# Patient Record
Sex: Female | Born: 1970 | Race: Black or African American | Hispanic: No | Marital: Married | Smoking: Current some day smoker
Health system: Southern US, Community
[De-identification: ages and names within clinical notes are randomized; demographics above are authoritative.]

## PROBLEM LIST (undated history)

## (undated) DIAGNOSIS — E7800 Pure hypercholesterolemia, unspecified: Secondary | ICD-10-CM

## (undated) DIAGNOSIS — I10 Essential (primary) hypertension: Secondary | ICD-10-CM

## (undated) DIAGNOSIS — M5136 Other intervertebral disc degeneration, lumbar region: Secondary | ICD-10-CM

## (undated) DIAGNOSIS — M51369 Other intervertebral disc degeneration, lumbar region without mention of lumbar back pain or lower extremity pain: Secondary | ICD-10-CM

## (undated) DIAGNOSIS — K219 Gastro-esophageal reflux disease without esophagitis: Secondary | ICD-10-CM

## (undated) HISTORY — PX: ABDOMINAL HYSTERECTOMY: SHX81

---

## 2001-07-31 ENCOUNTER — Other Ambulatory Visit: Admission: RE | Admit: 2001-07-31 | Discharge: 2001-07-31 | Payer: Self-pay | Admitting: Obstetrics and Gynecology

## 2003-09-17 ENCOUNTER — Ambulatory Visit (HOSPITAL_COMMUNITY): Admission: RE | Admit: 2003-09-17 | Discharge: 2003-09-17 | Payer: Self-pay | Admitting: Orthopedic Surgery

## 2003-10-21 ENCOUNTER — Other Ambulatory Visit: Admission: RE | Admit: 2003-10-21 | Discharge: 2003-10-21 | Payer: Self-pay | Admitting: Obstetrics and Gynecology

## 2004-10-12 ENCOUNTER — Observation Stay (HOSPITAL_COMMUNITY): Admission: RE | Admit: 2004-10-12 | Discharge: 2004-10-13 | Payer: Self-pay | Admitting: Obstetrics and Gynecology

## 2004-10-12 ENCOUNTER — Encounter (INDEPENDENT_AMBULATORY_CARE_PROVIDER_SITE_OTHER): Payer: Self-pay | Admitting: *Deleted

## 2007-05-11 ENCOUNTER — Emergency Department (HOSPITAL_COMMUNITY): Admission: EM | Admit: 2007-05-11 | Discharge: 2007-05-11 | Payer: Self-pay | Admitting: Emergency Medicine

## 2010-10-14 NOTE — Op Note (Signed)
NAME:  Morgan, Tran              ACCOUNT NO.:  [id number]   MEDICAL RECORD NO.:  [id number]          PATIENT TYPE:  OBV   LOCATION:  9399                          FACILITY:  [facility]   PHYSICIAN:  Janine Limbo, M.D.DATE OF BIRTH:  04-Apr-1971   DATE OF PROCEDURE:  10/12/2004  DATE OF DISCHARGE:                                 OPERATIVE REPORT   PREOPERATIVE DIAGNOSES:  1. Fibroid uterus.  2. Dysmenorrhea.  3. 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     POSTOPERATIVE DIAGNOSES:  1. Fibroid uterus.  2. Dysmenorrhea.  3. 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     PROCEDURE:  Vaginal hysterectomy.   SURGEON:  Janine Limbo, M.D.   FIRST ASSISTANT:  Elmira J. Lowell Guitar, P.A.-C.   ANESTHETIC:  General.   DISPOSITION:  Morgan Tran is a 40 year old female, para 2-0-0-2, who presents  with the above-mentioned diagnosis.  Nonsteroidal antiinflammatory agents  have not relieved her discomfort.  Hormonal therapy has not been her best  option.  The patient understands the indications for her surgical procedure,  and she accepts the risk of, but not limited to, anesthetic complications,  bleeding, infections, and possible damage to the surrounding organs.   FINDINGS:  The patient was noted to have a 10-12 size fibroid uterus (187  g).  Her fallopian tubes and ovaries appeared normal.   PROCEDURE:  The patient was taken to the operating room, where a general  anesthetic was given.  The patient's abdomen, perineum, and vagina were  prepped with multiple layers of Betadine.  A Foley catheter was placed in  the bladder.  The patient was sterilely draped.  Examination under  anesthesia was performed.  The cervix was injected with a diluted solution  of Pitressin and saline.  A circumferential incision was made around the  cervix, and the vaginal mucosa was advanced both anteriorly and posteriorly.  The posterior cul-de-sac was entered.  The  anterior cul-de-sac was entered.  Alternating from right to left, the uterosacral ligaments, paracervical  tissues, parametrial tissues, and uterine arteries were clamped, cut,  sutured, and tied securely.  The uterus was inverted through the posterior  colpotomy.  The upper pedicles were secured.  The uterus was transected from  the operative field.  The upper pedicles were then suture ligated and then  free tied.  There was a small amount of bleeding from the vaginal cuff, so  the vaginal cuff, so the vaginal cuff was sutured using a running-locking  suture.  There was a small amount of bleeding to the pelvic sidewall, and  figure-of-eight sutures were used for hemostasis.  Care was taken not to  damage any of the vital underlying structures.  Hemostasis was noted to be  adequate.  The sutures attached to the uterosacral ligaments were brought  out through the vaginal angles and tied securely.  A McCall culdoplasty  suture was placed in the posterior cul-de-sac incorporating the uterosacral  ligaments bilaterally and the posterior peritoneum.  A final check was made  for hemostasis, and again hemostasis was confirmed.  The vaginal cuff was  then closed using figure-of-eight sutures, including the anterior vaginal  mucosa, the anterior peritoneum, the posterior peritoneum, and the posterior  vaginal mucosa.  The McCall culdoplasty suture was tied securely, and the  apex of the vagina was noted to elevate into the midpelvis.  Sponge, needle,  and instrument counts were correct on two occasions.  The estimated blood  loss for the procedure was 200 cc.  The patient received 1,100 cc of fluid  through her IV line.  The estimated urine output was 350 cc.  The patient  was noted to drain clear yellow urine at the end of her procedure.  The  patient tolerated her procedure well.  She was awakened from her anesthetic  without difficulty.  She was taken to the recovery room in stable condition.  0  Vicryl was the suture material used throughout the procedure.      AVS/MEDQ  D:  10/12/2004  T:  10/12/2004  Job:  [id number]

## 2010-10-14 NOTE — H&P (Signed)
NAME:  Morgan Tran, Morgan Tran              ACCOUNT NO.:  [id number]   MEDICAL RECORD NO.:  [id number]          PATIENT TYPE:  AMB   LOCATION:  SDC                           [facility]  [facility]   PHYSICIAN:  Janine Limbo, M.D.DATE OF BIRTH:  04-04-1971   DATE OF ADMISSION:  10/12/2004  DATE OF DISCHARGE:                                HISTORY & PHYSICAL   HISTORY OF PRESENT ILLNESS:  Morgan Tran is a 40 year old female, para 2-0-0-  2, who presents for a vaginal hysterectomy.  The patient has been followed  at the [facility] for [facility].  The patient has a known history of fibroids and  dysmenorrhea.  Her discomfort has not been relieved by pain medication and  nonsteroidal antiinflammatory agents.  In addition, the patient had a Pap  smear that showed a low grade squamous intraepithelial lesion.  Colposcopically directed biopsies were performed, in April 2006, and that  showed a high grade squamous intraepithelial lesion.  The endocervical  curettage was benign.  The patient has had one cesarean delivery in the  past.  The patient denies a past history sexually transmitted infections.   OBSTETRICAL HISTORY:  The patient has had one term vaginal delivery and one  term cesarean delivery.   DRUG ALLERGIES:  The patient says that she is allergic to PENICILLIN and  this causes hives.   PAST MEDICAL HISTORY:  1. The patient has a history of hypertension and she currently takes      metoprolol and Triam.  2. She had a hernia repair at age 40.  3. She has migraine headaches.     SOCIAL HISTORY:  The patient denies cigarette use, alcohol use, and  recreational drug use.   REVIEW OF SYSTEMS:  The patient complains of dyspareunia as well as the  conditions mentioned above.   FAMILY HISTORY:  Noncontributory.   PHYSICAL EXAMINATION:  VITAL SIGNS:  Weight is 177 pounds.  HEENT:  Within normal limits.  CHEST:  Clear.  HEART:   Regular rate and rhythm.  BREASTS:  Without masses.  ABDOMEN:  Nontender.  EXTREMITIES:  Within normal limits.  NEUROLOGIC:  Grossly normal.  PELVIC:  External genitalia is normal.  The vagina is normal except for a  small cystocele.  The cervix is nontender and no lesions are present.  The  uterus is eight weeks size and irregular.  Adnexa:  No masses.  Rectovaginal  exam confirms.   LABORATORY VALUES:  The patient's endometrial biopsy was benign.  Her  ultrasound showed a 10.2 x 6.8-cm uterus with multiple fibroids.  The  largest fibroid measured 2.4 x 1.8-cm.  The endometrial thickness was 0.6-  cm.  The ovaries appeared normal on ultrasound.   ASSESSMENT:  1. Fibroid uterus.  2. Dysmenorrhea.  3. High grade squamous intraepithelial lesion.     PLAN:  The patient will undergo a vaginal hysterectomy.  She understands the  indications for her procedure, and she accepts the risk of, but no limited  to, anesthetic complications, bleeding, infections, and possible damage to  the surrounding  organs.      AVS/MEDQ  D:  10/11/2004  T:  10/11/2004  Job:  [id number]

## 2010-10-14 NOTE — Discharge Summary (Signed)
NAME:  Morgan Tran, Morgan Tran              ACCOUNT NO.:  [id number]   MEDICAL RECORD NO.:  [id number]          PATIENT TYPE:  OBV   LOCATION:  9316                          [facility]   PHYSICIAN:  Janine Limbo, M.D.DATE OF BIRTH:  [birth year]   DATE OF ADMISSION:  10/12/2004  DATE OF DISCHARGE:  10/13/2004                                 DISCHARGE SUMMARY   DISCHARGE DIAGNOSES:  1.  Fibroid uterus.  2.  Dysmenorrhea.  3.  Cervical intraepithelial neoplasia grade 3.   OPERATION:  On the date of admission the patient underwent a total vaginal  hysterectomy and tolerated the procedure well. The patient was found to have  a fibroid uterus which weighed 187 g with normal-appearing tubes and ovaries  bilaterally.   HISTORY OF PRESENT ILLNESS:  Ms. Morgan Tran is a 40 year old female para 2-0-0-2  who presents for vaginal hysterectomy because of dysmenorrhea and a high-  grade squamous intraepithelial lesion. Please see the patient's dictated  History and Physical Examination for details.   PREOPERATIVE PHYSICAL EXAMINATION:  VITAL SIGNS:  Weight is 177 pounds.  GENERAL:  Within normal limits.  PELVIC:  External genitalia is normal, vagina is normal except for a small  cystocele. Cervix is nontender and no lesions are present. Uterus is 8-week  size and irregular. Adnexa without masses. Rectovaginal exam confirms.   HOSPITAL COURSE:  On the date of admission the patient underwent  aforementioned procedure, tolerating it well. Postoperative course was  unremarkable with the patient having a postoperative hemoglobin of 13.3  (preoperative hemoglobin 14.9). By postoperative day #1 the patient had  resumed bowel and bladder function and was therefore deemed ready for  discharge home.   DISCHARGE MEDICATIONS:  1.  Phenergan 25 mg one tablet q.6h. as needed for nausea.  2.  Ibuprofen 600 mg one tablet with food q.6h. for 3 days, then as needed      for pain.  3.  Vicodin one to two tablets  q.4-6h. as needed for pain.  4.  Colace 100 mg one tablet twice daily until bowel movements are regular.  5.  Metoprolol 50 mg one tablet twice daily.  6.  Triamterine/hydrochlorothiazide 37.5/25 one tablet daily.   FOLLOW-UP:  The patient is scheduled for a 6 weeks postoperative visit with  Dr. Stefano Gaul on November 23, 2004 at 11 a.m.   DISCHARGE INSTRUCTIONS:  The patient was given a copy of [facility] postoperative instruction sheet. She is further advised to avoid  driving for 2 weeks, heavy lifting for 4 weeks, and intercourse for 6 weeks.  The patient's diet was without restriction.   FINAL PATHOLOGY:  Uterus and cervix:  Cervix with high-grade squamous  intraepithelial lesion, CIN-3. Endometrium:  Secretory endometrium, no  hyperplasia or malignancy identified. Myometrium:  Multiple leiomyomas.       EJP/MEDQ  D:  10/27/2004  T:  10/27/2004  Job:  [id number]

## 2010-10-19 ENCOUNTER — Emergency Department (HOSPITAL_COMMUNITY)
Admission: EM | Admit: 2010-10-19 | Discharge: 2010-10-19 | Disposition: A | Discharge status: Home / Self Care | Payer: PRIVATE HEALTH INSURANCE | Attending: Emergency Medicine | Admitting: Emergency Medicine

## 2010-10-19 ENCOUNTER — Emergency Department (HOSPITAL_COMMUNITY): Payer: PRIVATE HEALTH INSURANCE

## 2010-10-19 DIAGNOSIS — G43909 Migraine, unspecified, not intractable, without status migrainosus: Secondary | ICD-10-CM | POA: Insufficient documentation

## 2010-10-19 DIAGNOSIS — X58XXXA Exposure to other specified factors, initial encounter: Secondary | ICD-10-CM | POA: Insufficient documentation

## 2010-10-19 DIAGNOSIS — I10 Essential (primary) hypertension: Secondary | ICD-10-CM | POA: Insufficient documentation

## 2010-10-19 DIAGNOSIS — IMO0002 Reserved for concepts with insufficient information to code with codable children: Secondary | ICD-10-CM | POA: Insufficient documentation

## 2012-08-14 DIAGNOSIS — E8940 Asymptomatic postprocedural ovarian failure: Secondary | ICD-10-CM | POA: Insufficient documentation

## 2012-08-14 DIAGNOSIS — I10 Essential (primary) hypertension: Secondary | ICD-10-CM | POA: Insufficient documentation

## 2012-08-16 DIAGNOSIS — E785 Hyperlipidemia, unspecified: Secondary | ICD-10-CM | POA: Insufficient documentation

## 2013-04-17 ENCOUNTER — Emergency Department (HOSPITAL_COMMUNITY)
Admission: EM | Admit: 2013-04-17 | Discharge: 2013-04-17 | Disposition: A | Discharge status: Home / Self Care | Payer: Managed Care, Other (non HMO) | Attending: Emergency Medicine | Admitting: Emergency Medicine

## 2013-04-17 ENCOUNTER — Encounter (HOSPITAL_COMMUNITY): Payer: Self-pay | Admitting: Emergency Medicine

## 2013-04-17 DIAGNOSIS — I10 Essential (primary) hypertension: Secondary | ICD-10-CM | POA: Insufficient documentation

## 2013-04-17 DIAGNOSIS — Y990 Civilian activity done for income or pay: Secondary | ICD-10-CM | POA: Insufficient documentation

## 2013-04-17 DIAGNOSIS — Z880 Allergy status to penicillin: Secondary | ICD-10-CM | POA: Insufficient documentation

## 2013-04-17 DIAGNOSIS — Y9389 Activity, other specified: Secondary | ICD-10-CM | POA: Insufficient documentation

## 2013-04-17 DIAGNOSIS — IMO0002 Reserved for concepts with insufficient information to code with codable children: Secondary | ICD-10-CM | POA: Insufficient documentation

## 2013-04-17 DIAGNOSIS — Y9289 Other specified places as the place of occurrence of the external cause: Secondary | ICD-10-CM | POA: Insufficient documentation

## 2013-04-17 DIAGNOSIS — S3992XA Unspecified injury of lower back, initial encounter: Secondary | ICD-10-CM

## 2013-04-17 DIAGNOSIS — E7800 Pure hypercholesterolemia, unspecified: Secondary | ICD-10-CM | POA: Insufficient documentation

## 2013-04-17 DIAGNOSIS — X500XXA Overexertion from strenuous movement or load, initial encounter: Secondary | ICD-10-CM | POA: Insufficient documentation

## 2013-04-17 DIAGNOSIS — M5380 Other specified dorsopathies, site unspecified: Secondary | ICD-10-CM | POA: Insufficient documentation

## 2013-04-17 HISTORY — DX: Essential (primary) hypertension: I10

## 2013-04-17 HISTORY — DX: Pure hypercholesterolemia, unspecified: E78.00

## 2013-04-17 MED ORDER — CYCLOBENZAPRINE HCL 10 MG PO TABS
10.0000 mg | ORAL_TABLET | Freq: Once | ORAL | Status: AC
  Administered 2013-04-17: 10 mg via ORAL
  Filled 2013-04-17: qty 1

## 2013-04-17 MED ORDER — OXYCODONE-ACETAMINOPHEN 5-325 MG PO TABS
1.0000 | ORAL_TABLET | Freq: Once | ORAL | Status: AC
  Administered 2013-04-17: 1 via ORAL
  Filled 2013-04-17: qty 1

## 2013-04-17 MED ORDER — KETOROLAC TROMETHAMINE 60 MG/2ML IM SOLN
60.0000 mg | Freq: Once | INTRAMUSCULAR | Status: AC
  Administered 2013-04-17: 60 mg via INTRAMUSCULAR
  Filled 2013-04-17: qty 2

## 2013-04-17 MED ORDER — PREDNISONE (PAK) 10 MG PO TABS: ORAL_TABLET | Freq: Every day | ORAL | Status: DC

## 2013-04-17 MED ORDER — OXYCODONE-ACETAMINOPHEN 5-325 MG PO TABS: 1.0000 | ORAL_TABLET | ORAL | Status: DC | PRN

## 2013-04-17 MED ORDER — DIAZEPAM 5 MG PO TABS: 5.0000 mg | ORAL_TABLET | Freq: Two times a day (BID) | ORAL | Status: DC

## 2013-04-17 NOTE — ED Provider Notes (Signed)
CSN: [id number]     Arrival date & time 04/17/13  4098 History   First MD Initiated Contact with Patient 04/17/13 [phone]     Chief Complaint  Patient presents with  . Back Pain   (Consider location/radiation/quality/duration/timing/severity/associated sxs/prior Treatment) Patient is a 42 y.o. female presenting with back pain. The history is provided by the patient.  Back Pain Location:  Lumbar spine Quality:  Shooting Radiates to:  R posterior upper leg Pain severity:  Severe Worse during: worse withmovement. Onset quality:  Sudden Duration:  1 day Timing:  Constant Progression:  Worsening Chronicity:  New Relieved by:  Nothing Worsened by:  Movement, standing, twisting, ambulation and bending Ineffective treatments:  Muscle relaxants, cold packs and heating pad Associated symptoms: leg pain   Associated symptoms: no abdominal pain, no bladder incontinence, no bowel incontinence, no dysuria, no headaches, no pelvic pain and no weakness    Morgan Tran is a 42 y.o. female who presents to the ED with low back pain. She states she was in the bathroom at work and leaned over to get the soap and felt a sharp pain in the right side of her lower back. She took muscle relaxants x 2 last night and applied heat and then ice to the area. She was able to sleep last night but this morning when she started to get out of bed there was so much pain in her right side and right leg she had difficulty bearing weight on the right leg. She sat on the side of the bed for a while and then was able to get up. History of similar episode about 4 years ago and admitted to [facility] for a week. Had CT that showed bulging disc in lower lumbar area.  The flexeril she took last night was left over from that episode.   Past Medical History  Diagnosis Date  . Hypertension   . Hypercholesterolemia    Past Surgical History  Procedure Laterality Date  . Cesarean section    . Abdominal hysterectomy     No  family history on file. History  Substance Use Topics  . Smoking status: Never Smoker   . Smokeless tobacco: Not on file  . Alcohol Use: No   OB History   Grav Para Term Preterm Abortions TAB SAB Ect Mult Living                 Review of Systems  Constitutional: Negative for activity change.  HENT: Negative.   Eyes: Negative for visual disturbance.  Gastrointestinal: Negative for nausea, vomiting, abdominal pain and bowel incontinence.  Genitourinary: Negative for bladder incontinence, dysuria and pelvic pain.  Musculoskeletal: Positive for back pain.  Skin: Negative for rash.  Allergic/Immunologic: Negative for immunocompromised state.  Neurological: Negative for syncope, weakness, light-headedness and headaches.  Psychiatric/Behavioral: The patient is not nervous/anxious.     Allergies  Penicillins  Home Medications  No current outpatient prescriptions on file. BP 144/91  Pulse 84  Temp(Src) 98.3 F (36.8 C) (Oral)  Resp 18  Ht 5\' 6"  (1.676 m)  Wt 202 lb (91.627 kg)  BMI 32.62 kg/m2  SpO2 100% Physical Exam  Nursing note and vitals reviewed. Constitutional: She is oriented to person, place, and time. She appears well-developed and well-nourished. No distress.  Appears to be in extreme pain  HENT:  Head: Normocephalic and atraumatic.  Eyes: Conjunctivae and EOM are normal.  Neck: Normal range of motion. Neck supple.  Cardiovascular: Normal rate, regular rhythm and normal  heart sounds.   Pulmonary/Chest: Effort normal and breath sounds normal.  Abdominal: Soft. Bowel sounds are normal. There is no tenderness.  Musculoskeletal:       Lumbar back: She exhibits decreased range of motion, tenderness, pain and spasm. She exhibits normal pulse.       Back:  Pedal pulses equal bilateral, adequate circulation, good touch sensation. Difficulty with straight leg raises on the right.   Neurological: She is alert and oriented to person, place, and time. She has normal  strength and normal reflexes. No cranial nerve deficit or sensory deficit. Gait (due to pain, no foot drag) abnormal.  Skin: Skin is warm and dry.  Psychiatric: She has a normal mood and affect. Her behavior is normal.    ED Course  Procedures   EKG Interpretation   None       MDM  42 y.o. female with severe pain right lower back with radiation to the right leg. No loss of control of bladder or bowels. She has had some improvement after Flexeril 10 mg, Toradol 60 mg. IM and Percocet 5 mg. I have discussed in detail with the patient need for follow up and she voices understanding. She is stable for discharge without any immediate complications. Will start muscle relaxant, prednisone and pain management. She will return for any problems.    Medication List         diazepam 5 MG tablet  Commonly known as:  VALIUM  Take 1 tablet (5 mg total) by mouth 2 (two) times daily.     oxyCODONE-acetaminophen 5-325 MG per tablet  Commonly known as:  ROXICET  Take 1 tablet by mouth every 4 (four) hours as needed for severe pain.     predniSONE 10 MG tablet  Commonly known as:  STERAPRED UNI-PAK  Take by mouth daily. Take 6 tablets PO today then 5, 4, 3, 2, [address] [place], [place] 04/17/13 [phone]

## 2013-04-17 NOTE — ED Notes (Signed)
Pt reports yesterday had just used the bathroom and leaned over to get the soap to wash her hands.   Reports started having pain in lower back.  Pt says pain has gotten worse and radiates down r leg.

## 2013-04-17 NOTE — ED Notes (Signed)
Pt reports low back pain since yesterday when she was bending over. Pain has gotten worse since yesterday and pain now radiates down  right leg.

## 2013-04-18 NOTE — ED Provider Notes (Signed)
  Medical screening examination/treatment/procedure(s) were performed by non-physician practitioner and as supervising physician I was immediately available for consultation/collaboration.  EKG Interpretation   None          Gerhard Munch, MD 04/18/13 [phone]

## 2017-03-07 DIAGNOSIS — M7071 Other bursitis of hip, right hip: Secondary | ICD-10-CM | POA: Diagnosis not present

## 2017-03-07 DIAGNOSIS — M25551 Pain in right hip: Secondary | ICD-10-CM | POA: Diagnosis not present

## 2017-08-27 DIAGNOSIS — I10 Essential (primary) hypertension: Secondary | ICD-10-CM | POA: Diagnosis not present

## 2017-08-27 DIAGNOSIS — Z79899 Other long term (current) drug therapy: Secondary | ICD-10-CM | POA: Diagnosis not present

## 2017-08-27 DIAGNOSIS — M545 Low back pain: Secondary | ICD-10-CM | POA: Diagnosis not present

## 2017-09-10 DIAGNOSIS — I10 Essential (primary) hypertension: Secondary | ICD-10-CM | POA: Diagnosis not present

## 2017-09-10 DIAGNOSIS — M545 Low back pain: Secondary | ICD-10-CM | POA: Diagnosis not present

## 2017-11-16 DIAGNOSIS — H04559 Acquired stenosis of unspecified nasolacrimal duct: Secondary | ICD-10-CM | POA: Diagnosis not present

## 2017-11-24 DIAGNOSIS — H109 Unspecified conjunctivitis: Secondary | ICD-10-CM | POA: Diagnosis not present

## 2018-01-10 DIAGNOSIS — H10021 Other mucopurulent conjunctivitis, right eye: Secondary | ICD-10-CM | POA: Diagnosis not present

## 2018-01-10 DIAGNOSIS — H04309 Unspecified dacryocystitis of unspecified lacrimal passage: Secondary | ICD-10-CM | POA: Diagnosis not present

## 2018-04-22 ENCOUNTER — Other Ambulatory Visit: Payer: Self-pay | Admitting: Family Medicine

## 2018-04-22 DIAGNOSIS — Z1231 Encounter for screening mammogram for malignant neoplasm of breast: Secondary | ICD-10-CM

## 2018-05-02 ENCOUNTER — Encounter (INDEPENDENT_AMBULATORY_CARE_PROVIDER_SITE_OTHER): Payer: Self-pay

## 2018-05-02 ENCOUNTER — Ambulatory Visit
Admission: RE | Admit: 2018-05-02 | Discharge: 2018-05-02 | Disposition: A | Discharge status: Home / Self Care | Payer: 59 | Source: Ambulatory Visit | Attending: Family Medicine | Admitting: Family Medicine

## 2018-05-02 DIAGNOSIS — Z1231 Encounter for screening mammogram for malignant neoplasm of breast: Secondary | ICD-10-CM

## 2018-09-27 ENCOUNTER — Other Ambulatory Visit: Payer: Self-pay | Admitting: Internal Medicine

## 2018-09-27 ENCOUNTER — Encounter: Payer: Self-pay | Admitting: Internal Medicine

## 2018-10-02 ENCOUNTER — Ambulatory Visit: Payer: 59 | Admitting: Internal Medicine

## 2018-11-01 ENCOUNTER — Ambulatory Visit: Payer: 59 | Admitting: Internal Medicine

## 2018-12-11 ENCOUNTER — Ambulatory Visit (INDEPENDENT_AMBULATORY_CARE_PROVIDER_SITE_OTHER): Payer: Commercial Managed Care - PPO | Admitting: Internal Medicine

## 2018-12-11 ENCOUNTER — Other Ambulatory Visit: Payer: Self-pay

## 2018-12-11 ENCOUNTER — Encounter: Payer: Self-pay | Admitting: Internal Medicine

## 2018-12-11 VITALS — BP 132/74 | HR 90 | Ht 66.0 in | Wt 218.0 lb

## 2018-12-11 DIAGNOSIS — I10 Essential (primary) hypertension: Secondary | ICD-10-CM | POA: Diagnosis not present

## 2018-12-11 DIAGNOSIS — M5136 Other intervertebral disc degeneration, lumbar region: Secondary | ICD-10-CM | POA: Diagnosis not present

## 2018-12-11 DIAGNOSIS — Z6835 Body mass index (BMI) 35.0-35.9, adult: Secondary | ICD-10-CM

## 2018-12-11 DIAGNOSIS — F17200 Nicotine dependence, unspecified, uncomplicated: Secondary | ICD-10-CM

## 2018-12-11 DIAGNOSIS — K219 Gastro-esophageal reflux disease without esophagitis: Secondary | ICD-10-CM

## 2018-12-11 DIAGNOSIS — M51369 Other intervertebral disc degeneration, lumbar region without mention of lumbar back pain or lower extremity pain: Secondary | ICD-10-CM

## 2018-12-11 NOTE — Progress Notes (Signed)
Date:  12/11/2018   Name:  Morgan Tran   DOB:  [birth year]   MRN:  [id number]   Chief Complaint: Establish Care (Moved to [facility] for 6 years and moved to [place] in May of last year. ), Generalized Body Aches, Gastroesophageal Reflux (Had lump in throat- now gone. But does have acid reflux. ), and Back Pain (Bulging discs in back. Back pain on and off. Back goes out once yearly.  )  Gastroesophageal Reflux She complains of heartburn. She reports no abdominal pain, no chest pain, no coughing, no sore throat or no wheezing. This is a recurrent problem. The problem occurs occasionally. The problem has been resolved. The symptoms are aggravated by certain foods (tomato or ketchup). Pertinent negatives include no fatigue. She has tried an antacid for the symptoms. The treatment provided significant relief.  Back Pain This is a chronic problem. The problem occurs intermittently. The pain is present in the lumbar spine. Pertinent negatives include no abdominal pain, chest pain, dysuria, fever, headaches, leg pain or numbness. 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 She has tried NSAIDs and ice for the symptoms. The treatment provided significant relief.  Hypertension This is a chronic (onset age 27) problem. The problem is controlled. Pertinent negatives include no blurred vision, chest pain, headaches, palpitations, peripheral edema or shortness of breath. Past treatments include angiotensin blockers. The current treatment provides significant improvement.    Review of Systems  Constitutional: Negative for chills, fatigue and fever.  HENT: Negative for sore throat and trouble swallowing.   Eyes: Negative for blurred vision.  Respiratory: Negative for cough, chest tightness, shortness of breath and wheezing.   Cardiovascular: Negative for chest pain, palpitations and leg swelling.  Gastrointestinal: Positive for heartburn. Negative for abdominal pain.  Genitourinary: Negative  for dysuria, frequency and hematuria.  Musculoskeletal: Positive for back pain and myalgias (general muscles aches - migratory).  Skin: Negative for rash.  Neurological: Negative for dizziness, numbness and headaches.  Psychiatric/Behavioral: Negative for dysphoric mood and sleep disturbance. The patient is not nervous/anxious.     Patient Active Problem List   Diagnosis Date Noted  . Degenerative disc disease, lumbar 12/11/2018  . GERD (gastroesophageal reflux disease) 12/11/2018  . Tobacco use disorder 12/11/2018  . Hyperlipemia 08/16/2012  . HTN (hypertension), benign 08/14/2012  . 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 Penicillins Hives    Past Surgical History:  Procedure Laterality Date  . ABDOMINAL HYSTERECTOMY     still have both ovaries  . CESAREAN SECTION      Social History   Tobacco Use  . Smoking status: Current Every Day Smoker    Packs/day: 0.25    Years: 1.00    Pack years: 0.25    Types: Cigarettes  . Smokeless tobacco: Never Used  . Tobacco comment: wants to quit before she turns 48 years old  Substance Use Topics  . Alcohol use: No  . Drug use: No     Medication list has been reviewed and updated.  Current Meds  Medication Sig  . calcium-vitamin D (OSCAL WITH D) 500-200 MG-UNIT tablet Take 1 tablet by mouth.  . losartan (COZAAR) 25 MG tablet Take 25 mg by mouth daily.  . Prenatal Vit-Fe Fumarate-FA (ONE VITE WOMENS PLUS) 27-1 MG TABS Take by mouth.    PHQ 2/9 Scores 12/11/2018  PHQ - 2 Score 0  PHQ- 9 Score 3    BP Readings from Last 3  Encounters:  12/11/18 132/74  04/17/13 144/91    Physical Exam Vitals signs and nursing note reviewed.  Constitutional:      General: She is not in acute distress.    Appearance: She is well-developed.  HENT:     Head: Normocephalic and atraumatic.  Neck:     Musculoskeletal: Normal range of motion.     Vascular: No carotid bruit.  Cardiovascular:     Rate and  Rhythm: Normal rate and regular rhythm.     Pulses: Normal pulses.     Heart sounds: No murmur.  Pulmonary:     Effort: Pulmonary effort is normal. No respiratory distress.  Abdominal:     General: Abdomen is flat.     Palpations: Abdomen is soft.     Tenderness: There is no abdominal tenderness. There is no guarding or rebound.  Musculoskeletal: Normal range of motion.     Lumbar back: She exhibits no tenderness, no bony tenderness and no spasm.     Right lower leg: No edema.     Left lower leg: No edema.  Lymphadenopathy:     Cervical: No cervical adenopathy.  Skin:    General: Skin is warm and dry.     Capillary Refill: Capillary refill takes less than 2 seconds.     Findings: No rash.  Neurological:     Mental Status: She is alert and oriented to person, place, and time.     Deep Tendon Reflexes:     Reflex Scores:      Patellar reflexes are 2+ on the right side and 2+ on the left side. Psychiatric:        Behavior: Behavior normal.        Thought Content: Thought content normal.     Wt Readings from Last 3 Encounters:  12/11/18 218 lb (98.9 kg)  04/17/13 202 lb (91.6 kg)    BP 132/74   Pulse 90   Ht 5\' 6"  (1.676 m)   Wt 218 lb (98.9 kg)   SpO2 96%   BMI 35.19 kg/m   Assessment and Plan: 1. HTN (hypertension), benign Controlled, continue losartan   2. Degenerative disc disease, lumbar Intermittent sx relieved by Aleve and ice No indication for Ortho referral at this time  3. Gastroesophageal reflux disease, esophagitis presence not specified Continue TUMS PRN  4. Tobacco use disorder Pt is intent on quitting by her birthday next month  5. BMI 35.0-35.9,adult Long discussion about regular exercise, including core strengthening and walking/swimming Increase water intact Consider intermittent fasting Will check labs at next visit   Partially dictated using Dragon software. Any errors are unintentional.  Bari EdwardLaura Gabriela Irigoyen, MD [facility]  [facility]  12/11/2018

## 2019-04-16 ENCOUNTER — Encounter: Payer: Commercial Managed Care - PPO | Admitting: Internal Medicine

## 2019-06-16 ENCOUNTER — Telehealth: Payer: Self-pay

## 2019-06-16 ENCOUNTER — Ambulatory Visit (INDEPENDENT_AMBULATORY_CARE_PROVIDER_SITE_OTHER): Payer: Commercial Managed Care - PPO | Admitting: Internal Medicine

## 2019-06-16 ENCOUNTER — Encounter: Payer: Self-pay | Admitting: Internal Medicine

## 2019-06-16 ENCOUNTER — Other Ambulatory Visit: Payer: Self-pay

## 2019-06-16 ENCOUNTER — Other Ambulatory Visit: Payer: Self-pay | Admitting: Internal Medicine

## 2019-06-16 VITALS — BP 118/74 | HR 74 | Temp 98.4°F | Ht 66.0 in | Wt 216.0 lb

## 2019-06-16 DIAGNOSIS — Z1231 Encounter for screening mammogram for malignant neoplasm of breast: Secondary | ICD-10-CM

## 2019-06-16 DIAGNOSIS — E782 Mixed hyperlipidemia: Secondary | ICD-10-CM

## 2019-06-16 DIAGNOSIS — Z1211 Encounter for screening for malignant neoplasm of colon: Secondary | ICD-10-CM

## 2019-06-16 DIAGNOSIS — Z0000 Encounter for general adult medical examination without abnormal findings: Secondary | ICD-10-CM | POA: Diagnosis not present

## 2019-06-16 DIAGNOSIS — I10 Essential (primary) hypertension: Secondary | ICD-10-CM | POA: Diagnosis not present

## 2019-06-16 LAB — POCT URINALYSIS DIPSTICK
Bilirubin, UA: NEGATIVE
Blood, UA: NEGATIVE
Glucose, UA: NEGATIVE
Ketones, UA: NEGATIVE
Leukocytes, UA: NEGATIVE
Nitrite, UA: NEGATIVE
Protein, UA: NEGATIVE
Spec Grav, UA: 1.01 (ref 1.010–1.025)
Urobilinogen, UA: 0.2 E.U./dL
pH, UA: 6.5 (ref 5.0–8.0)

## 2019-06-16 MED ORDER — NA SULFATE-K SULFATE-MG SULF 17.5-3.13-1.6 GM/177ML PO SOLN: 354.0000 mL | Freq: Once | ORAL | 0 refills | Status: AC

## 2019-06-16 NOTE — Telephone Encounter (Signed)
Gastroenterology Pre-Procedure Review    PATIENT REVIEW QUESTIONS: The patient responded to the following health history questions as indicated:    1. Are you having any GI issues? no 2. Do you have a personal history of Polyps? no 3. Do you have a family history of Colon Cancer or Polyps? Mom, Dad and Sister colon Polyps  4. Diabetes Mellitus? no 5. Joint replacements in the past 12 months?no 6. Major health problems in the past 3 months?no 7. Any artificial heart valves, MVP, or defibrillator?no    MEDICATIONS & ALLERGIES:    Patient reports the following regarding taking any anticoagulation/antiplatelet therapy:   Plavix, Coumadin, Eliquis, Xarelto, Lovenox, Pradaxa, Brilinta, or Effient? no Aspirin? no  Patient confirms/reports the following medications:  Current Outpatient Medications  Medication Sig Dispense Refill  . calcium-vitamin D (OSCAL WITH D) 500-200 MG-UNIT tablet Take 1 tablet by mouth.    . cyclobenzaprine (FLEXERIL) 10 MG tablet 10 mg 3 (three) times daily as needed.     Marland Kitchen losartan-hydrochlorothiazide (HYZAAR) 100-25 MG tablet Take 1 tablet by mouth daily.     . Prenatal Vit-Fe Fumarate-FA (ONE VITE WOMENS PLUS) 27-1 MG TABS Take by mouth.     No current facility-administered medications for this visit.    Patient confirms/reports the following allergies:  Allergies  Allergen Reactions  . Penicillins Hives    No orders of the defined types were placed in this encounter.   AUTHORIZATION INFORMATION Primary Insurance: 1D#: Group #:  Secondary Insurance: 1D#: Group #:  SCHEDULE INFORMATION: Date: 07/04/2019 Time: [place]: [place]

## 2019-06-16 NOTE — Progress Notes (Signed)
Date:  06/16/2019   Name:  Morgan Tran   DOB:  07-03-1970   MRN:  [id number]   Chief Complaint: Annual Exam (Breast Exam. ) Morgan Tran is a 49 y.o. female who presents today for her Complete Annual Exam. She feels well. She reports exercising rarely. She reports she is sleeping fairly well. She denies breast issues. 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    Mammogram  04/2018 Pap - discontinued Colonoscopy - due Immunization History  Administered Date(s) Administered  . Tdap 12/05/2012    Hypertension This is a chronic problem. The problem is controlled. Pertinent negatives include no chest pain, headaches, palpitations or shortness of breath. Past treatments include angiotensin blockers and diuretics. The current treatment provides significant improvement. There are no compliance problems.     Review of Systems  Constitutional: Negative for chills, fatigue and fever.  HENT: Negative for congestion, hearing loss, tinnitus, trouble swallowing and voice change.   Eyes: Negative for visual disturbance.  Respiratory: Negative for cough, chest tightness, shortness of breath and wheezing.   Cardiovascular: Negative for chest pain, palpitations and leg swelling.  Gastrointestinal: Negative for abdominal pain, constipation, diarrhea and vomiting.  Endocrine: Negative for polydipsia and polyuria.  Genitourinary: Negative for dysuria, frequency, genital sores, vaginal bleeding and vaginal discharge.  Musculoskeletal: Negative for arthralgias, gait problem and joint swelling.  Skin: Negative for color change and rash.  Neurological: Negative for dizziness, tremors, light-headedness and headaches.  Hematological: Negative for adenopathy. Does not bruise/bleed easily.  Psychiatric/Behavioral: Negative for dysphoric mood and sleep disturbance. The patient is not nervous/anxious.     Patient Active Problem List   Diagnosis Date Noted  . Mixed  hyperlipidemia 06/16/2019  . Degenerative disc disease, lumbar 12/11/2018  . GERD (gastroesophageal reflux disease) 12/11/2018  . Tobacco use disorder 12/11/2018  . BMI 35.0-35.9,adult 12/11/2018  . HTN (hypertension), benign 08/14/2012  . 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 Penicillins Hives    Past Surgical History:  Procedure Laterality Date  . ABDOMINAL HYSTERECTOMY     still have both ovaries  . CESAREAN SECTION      Social History   Tobacco Use  . Smoking status: Current Every Day Smoker    Packs/day: 0.05    Years: 1.00    Pack years: 0.05    Types: Cigarettes  . Smokeless tobacco: Never Used  . Tobacco comment: 2 ciggs daily  Substance Use Topics  . Alcohol use: No  . Drug use: No     Medication list has been reviewed and updated.  Current Meds  Medication Sig  . calcium-vitamin D (OSCAL WITH D) 500-200 MG-UNIT tablet Take 1 tablet by mouth.  . losartan-hydrochlorothiazide (HYZAAR) 100-25 MG tablet Take 1 tablet by mouth daily.   . Prenatal Vit-Fe Fumarate-FA (ONE VITE WOMENS PLUS) 27-1 MG TABS Take by mouth.    PHQ 2/9 Scores 06/16/2019 12/11/2018  PHQ - 2 Score 1 0  PHQ- 9 Score 7 3    BP Readings from Last 3 Encounters:  06/16/19 118/74  12/11/18 132/74  04/17/13 144/91    Physical Exam Vitals and nursing note reviewed.  Constitutional:      General: She is not in acute distress.    Appearance: She is well-developed.  HENT:     Head: Normocephalic and atraumatic.     Right Ear: Tympanic membrane and ear canal normal.     Left Ear: Tympanic membrane and ear  canal normal.     Nose:     Right Sinus: No maxillary sinus tenderness.     Left Sinus: No maxillary sinus tenderness.  Eyes:     General: No scleral icterus.       Right eye: No discharge.        Left eye: No discharge.     Conjunctiva/sclera: Conjunctivae normal.  Neck:     Thyroid: No thyromegaly.     Vascular: No carotid bruit.  Cardiovascular:       Rate and Rhythm: Normal rate and regular rhythm.     Pulses: Normal pulses.     Heart sounds: Normal heart sounds.  Pulmonary:     Effort: Pulmonary effort is normal. No respiratory distress.     Breath sounds: No wheezing.  Chest:     Breasts:        Right: No mass, nipple discharge, skin change or tenderness.        Left: No mass, nipple discharge, skin change or tenderness.  Abdominal:     General: Bowel sounds are normal.     Palpations: Abdomen is soft.     Tenderness: There is no abdominal tenderness.  Musculoskeletal:        General: Normal range of motion.     Cervical back: Normal range of motion. No erythema.     Right lower leg: No edema.     Left lower leg: No edema.  Lymphadenopathy:     Cervical: No cervical adenopathy.  Skin:    General: Skin is warm and dry.     Capillary Refill: Capillary refill takes less than 2 seconds.     Findings: No rash.  Neurological:     General: No focal deficit present.     Mental Status: She is alert and oriented to person, place, and time.     Cranial Nerves: No cranial nerve deficit.     Sensory: No sensory deficit.     Deep Tendon Reflexes: Reflexes are normal and symmetric.  Psychiatric:        Attention and Perception: Attention normal.        Mood and Affect: Mood normal.        Speech: Speech normal.        Behavior: Behavior normal.        Thought Content: Thought content normal.        Cognition and Memory: Cognition normal.        Judgment: Judgment normal.     Wt Readings from Last 3 Encounters:  06/16/19 216 lb (98 kg)  12/11/18 218 lb (98.9 kg)  04/17/13 202 lb (91.6 kg)    BP 118/74   Pulse 74   Temp 98.4 F (36.9 C) (Oral)   Ht 5\' 6"  (1.676 m)   Wt 216 lb (98 kg)   SpO2 98%   BMI 34.86 kg/m   Assessment and Plan: 1. Annual physical exam Normal exam except for weight Continue diet changes and try to add regular exercise - TSH - POCT urinalysis dipstick  2. Encounter for screening  mammogram for breast cancer scheduled  3. Colon cancer screening Due for screening colonoscopy age 52  Multiple family members with polyps  - Ambulatory referral to Gastroenterology  4. HTN (hypertension), benign Clinically stable exam with well controlled BP on losartan hct. Tolerating medications without side effects at this time. Pt to continue current regimen and low sodium diet; benefits of regular exercise as able discussed. - CBC with Differential/Platelet -  Comprehensive metabolic panel  5. Mixed hyperlipidemia Check lipids and advise if medication is needed - Lipid panel   Partially dictated using Dragon software. Any errors are unintentional.  Bari Edward, MD [facility]  06/16/2019

## 2019-06-17 ENCOUNTER — Encounter: Payer: Self-pay | Admitting: Internal Medicine

## 2019-06-17 LAB — CBC WITH DIFFERENTIAL/PLATELET
Basophils Absolute: 0.1 10*3/uL (ref 0.0–0.2)
Basos: 2 %
EOS (ABSOLUTE): 0.2 10*3/uL (ref 0.0–0.4)
Eos: 4 %
Hematocrit: 45.4 % (ref 34.0–46.6)
Hemoglobin: 15.2 g/dL (ref 11.1–15.9)
Immature Grans (Abs): 0 10*3/uL (ref 0.0–0.1)
Immature Granulocytes: 0 %
Lymphocytes Absolute: 2.7 10*3/uL (ref 0.7–3.1)
Lymphs: 46 %
MCH: 29.2 pg (ref 26.6–33.0)
MCHC: 33.5 g/dL (ref 31.5–35.7)
MCV: 87 fL (ref 79–97)
Monocytes Absolute: 0.5 10*3/uL (ref 0.1–0.9)
Monocytes: 9 %
Neutrophils Absolute: 2.2 10*3/uL (ref 1.4–7.0)
Neutrophils: 39 %
Platelets: 340 10*3/uL (ref 150–450)
RBC: 5.2 x10E6/uL (ref 3.77–5.28)
RDW: 12.7 % (ref 11.7–15.4)
WBC: 5.7 10*3/uL (ref 3.4–10.8)

## 2019-06-17 LAB — COMPREHENSIVE METABOLIC PANEL
ALT: 10 IU/L (ref 0–32)
AST: 20 IU/L (ref 0–40)
Albumin/Globulin Ratio: 1.3 (ref 1.2–2.2)
Albumin: 4 g/dL (ref 3.8–4.8)
Alkaline Phosphatase: 87 IU/L (ref 39–117)
BUN/Creatinine Ratio: 15 (ref 9–23)
BUN: 13 mg/dL (ref 6–24)
Bilirubin Total: 0.5 mg/dL (ref 0.0–1.2)
CO2: 26 mmol/L (ref 20–29)
Calcium: 9.8 mg/dL (ref 8.7–10.2)
Chloride: 102 mmol/L (ref 96–106)
Creatinine, Ser: 0.88 mg/dL (ref 0.57–1.00)
GFR calc Af Amer: 89 mL/min/{1.73_m2} (ref >59)
GFR calc non Af Amer: 77 mL/min/{1.73_m2} (ref >59)
Globulin, Total: 3 g/dL (ref 1.5–4.5)
Glucose: 95 mg/dL (ref 65–99)
Potassium: 4.6 mmol/L (ref 3.5–5.2)
Sodium: 140 mmol/L (ref 134–144)
Total Protein: 7 g/dL (ref 6.0–8.5)

## 2019-06-17 LAB — LIPID PANEL
Chol/HDL Ratio: 4 ratio (ref 0.0–4.4)
Cholesterol, Total: 258 mg/dL — ABNORMAL HIGH (ref 100–199)
HDL: 64 mg/dL (ref >39)
LDL Chol Calc (NIH): 183 mg/dL — ABNORMAL HIGH (ref 0–99)
Triglycerides: 68 mg/dL (ref 0–149)
VLDL Cholesterol Cal: 11 mg/dL (ref 5–40)

## 2019-06-17 LAB — TSH: TSH: 0.978 u[IU]/mL (ref 0.450–4.500)

## 2019-06-19 ENCOUNTER — Other Ambulatory Visit: Payer: Self-pay

## 2019-06-19 ENCOUNTER — Ambulatory Visit
Admission: RE | Admit: 2019-06-19 | Discharge: 2019-06-19 | Disposition: A | Discharge status: Home / Self Care | Payer: Commercial Managed Care - PPO | Source: Ambulatory Visit | Attending: Internal Medicine | Admitting: Internal Medicine

## 2019-06-19 DIAGNOSIS — Z1231 Encounter for screening mammogram for malignant neoplasm of breast: Secondary | ICD-10-CM | POA: Insufficient documentation

## 2019-06-26 ENCOUNTER — Encounter: Payer: Self-pay | Admitting: Gastroenterology

## 2019-06-26 ENCOUNTER — Other Ambulatory Visit: Payer: Self-pay

## 2019-07-02 ENCOUNTER — Other Ambulatory Visit
Admission: RE | Admit: 2019-07-02 | Discharge: 2019-07-02 | Disposition: A | Discharge status: Home / Self Care | Payer: Commercial Managed Care - PPO | Source: Ambulatory Visit | Attending: Gastroenterology | Admitting: Gastroenterology

## 2019-07-02 DIAGNOSIS — Z20822 Contact with and (suspected) exposure to covid-19: Secondary | ICD-10-CM | POA: Diagnosis not present

## 2019-07-02 DIAGNOSIS — Z01812 Encounter for preprocedural laboratory examination: Secondary | ICD-10-CM | POA: Insufficient documentation

## 2019-07-02 LAB — SARS CORONAVIRUS 2 (TAT 6-24 HRS): SARS Coronavirus 2: NEGATIVE

## 2019-07-02 NOTE — Anesthesia Preprocedure Evaluation (Addendum)
Anesthesia Evaluation  Patient identified by MRN, date of birth, ID band Patient awake    Reviewed: Allergy & Precautions, NPO status , Patient's Chart, lab work & pertinent test results  History of Anesthesia Complications Negative for: history of anesthetic complications  Airway Mallampati: II  TM Distance: >3 FB Neck ROM: Full    Dental   Pulmonary Current Smoker and Patient abstained from smoking.,    breath sounds clear to auscultation       Cardiovascular hypertension, (-) angina(-) DOE  Rhythm:Regular Rate:Normal   HLD   Neuro/Psych    GI/Hepatic GERD  ,  Endo/Other  Morbid obesity (BMI 35)  Renal/GU      Musculoskeletal  (+) Arthritis ,   Abdominal   Peds  Hematology   Anesthesia Other Findings   Reproductive/Obstetrics                            Anesthesia Physical Anesthesia Plan  ASA: II  Anesthesia Plan: General   Post-op Pain Management:    Induction: Intravenous  PONV Risk Score and Plan: 2 and Propofol infusion, TIVA and Treatment may vary due to age or medical condition  Airway Management Planned: Natural Airway and Nasal Cannula  Additional Equipment:   Intra-op Plan:   Post-operative Plan:   Informed Consent: I have reviewed the patients History and Physical, chart, labs and discussed the procedure including the risks, benefits and alternatives for the proposed anesthesia with the patient or authorized representative who has indicated his/her understanding and acceptance.       Plan Discussed with: CRNA and Anesthesiologist  Anesthesia Plan Comments:         Anesthesia Quick Evaluation

## 2019-07-03 NOTE — Discharge Instructions (Signed)
General Anesthesia, Adult, Care After This sheet gives you information about how to care for yourself after your procedure. Your health care provider may also give you more specific instructions. If you have problems or questions, contact your health care provider. What can I expect after the procedure? After the procedure, the following side effects are common:  Pain or discomfort at the IV site.  Nausea.  Vomiting.  Sore throat.  Trouble concentrating.  Feeling cold or chills.  Weak or tired.  Sleepiness and fatigue.  Soreness and body aches. These side effects can affect parts of the body that were not involved in surgery. Follow these instructions at home:  For at least 24 hours after the procedure:  Have a responsible adult stay with you. It is important to have someone help care for you until you are awake and alert.  Rest as needed.  Do not: ? Participate in activities in which you could fall or become injured. ? Drive. ? Use heavy machinery. ? Drink alcohol. ? Take sleeping pills or medicines that cause drowsiness. ? Make important decisions or sign legal documents. ? Take care of children on your own. Eating and drinking  Follow any instructions from your health care provider about eating or drinking restrictions.  When you feel hungry, start by eating small amounts of foods that are soft and easy to digest (bland), such as toast. Gradually return to your regular diet.  Drink enough fluid to keep your urine pale yellow.  If you vomit, rehydrate by drinking water, juice, or clear broth. General instructions  If you have sleep apnea, surgery and certain medicines can increase your risk for breathing problems. Follow instructions from your health care provider about wearing your sleep device: ? Anytime you are sleeping, including during daytime naps. ? While taking prescription pain medicines, sleeping medicines, or medicines that make you drowsy.  Return to  your normal activities as told by your health care provider. Ask your health care provider what activities are safe for you.  Take over-the-counter and prescription medicines only as told by your health care provider.  If you smoke, do not smoke without supervision.  Keep all follow-up visits as told by your health care provider. This is important. Contact a health care provider if:  You have nausea or vomiting that does not get better with medicine.  You cannot eat or drink without vomiting.  You have pain that does not get better with medicine.  You are unable to pass urine.  You develop a skin rash.  You have a fever.  You have redness around your IV site that gets worse. Get help right away if:  You have difficulty breathing.  You have chest pain.  You have blood in your urine or stool, or you vomit blood. Summary  After the procedure, it is common to have a sore throat or nausea. It is also common to feel tired.  Have a responsible adult stay with you for the first 24 hours after general anesthesia. It is important to have someone help care for you until you are awake and alert.  When you feel hungry, start by eating small amounts of foods that are soft and easy to digest (bland), such as toast. Gradually return to your regular diet.  Drink enough fluid to keep your urine pale yellow.  Return to your normal activities as told by your health care provider. Ask your health care provider what activities are safe for you. This information is not   intended to replace advice given to you by your health care provider. Make sure you discuss any questions you have with your health care provider. Document Revised: 05/18/2017 Document Reviewed: 12/29/2016 Elsevier Patient Education  2020 Elsevier Inc.  

## 2019-07-04 ENCOUNTER — Encounter
Admission: RE | Disposition: A | Discharge status: Home / Self Care | Payer: Self-pay | Source: Home / Self Care | Attending: Gastroenterology

## 2019-07-04 ENCOUNTER — Ambulatory Visit: Payer: Commercial Managed Care - PPO | Admitting: Anesthesiology

## 2019-07-04 ENCOUNTER — Encounter: Payer: Self-pay | Admitting: Gastroenterology

## 2019-07-04 ENCOUNTER — Ambulatory Visit
Admission: RE | Admit: 2019-07-04 | Discharge: 2019-07-04 | Disposition: A | Discharge status: Home / Self Care | Payer: Commercial Managed Care - PPO | Attending: Gastroenterology | Admitting: Gastroenterology

## 2019-07-04 ENCOUNTER — Other Ambulatory Visit: Payer: Self-pay

## 2019-07-04 DIAGNOSIS — Z1211 Encounter for screening for malignant neoplasm of colon: Secondary | ICD-10-CM | POA: Diagnosis present

## 2019-07-04 DIAGNOSIS — K635 Polyp of colon: Secondary | ICD-10-CM

## 2019-07-04 DIAGNOSIS — Z79899 Other long term (current) drug therapy: Secondary | ICD-10-CM | POA: Insufficient documentation

## 2019-07-04 DIAGNOSIS — I10 Essential (primary) hypertension: Secondary | ICD-10-CM | POA: Diagnosis not present

## 2019-07-04 DIAGNOSIS — Z8371 Family history of colonic polyps: Secondary | ICD-10-CM | POA: Diagnosis not present

## 2019-07-04 DIAGNOSIS — K5730 Diverticulosis of large intestine without perforation or abscess without bleeding: Secondary | ICD-10-CM | POA: Diagnosis not present

## 2019-07-04 HISTORY — DX: Gastro-esophageal reflux disease without esophagitis: K21.9

## 2019-07-04 HISTORY — PX: COLONOSCOPY WITH PROPOFOL: SHX5780

## 2019-07-04 HISTORY — DX: Other intervertebral disc degeneration, lumbar region without mention of lumbar back pain or lower extremity pain: M51.369

## 2019-07-04 HISTORY — DX: Other intervertebral disc degeneration, lumbar region: M51.36

## 2019-07-04 HISTORY — PX: POLYPECTOMY: SHX5525

## 2019-07-04 SURGERY — COLONOSCOPY WITH PROPOFOL
Anesthesia: General

## 2019-07-04 MED ORDER — PROPOFOL 10 MG/ML IV BOLUS
INTRAVENOUS | Status: DC | PRN
  Administered 2019-07-04: 70 mg via INTRAVENOUS
  Administered 2019-07-04: 40 mg via INTRAVENOUS
  Administered 2019-07-04: 30 mg via INTRAVENOUS
  Administered 2019-07-04: 20 mg via INTRAVENOUS
  Administered 2019-07-04: 40 mg via INTRAVENOUS

## 2019-07-04 MED ORDER — ACETAMINOPHEN 10 MG/ML IV SOLN: 1000.0000 mg | Freq: Once | INTRAVENOUS | Status: DC | PRN

## 2019-07-04 MED ORDER — LIDOCAINE HCL (CARDIAC) PF 100 MG/5ML IV SOSY
PREFILLED_SYRINGE | INTRAVENOUS | Status: DC | PRN
  Administered 2019-07-04: 40 mg via INTRAVENOUS

## 2019-07-04 MED ORDER — LACTATED RINGERS IV SOLN
100.0000 mL/h | INTRAVENOUS | Status: DC
  Administered 2019-07-04: 100 mL/h via INTRAVENOUS

## 2019-07-04 MED ORDER — ONDANSETRON HCL 4 MG/2ML IJ SOLN: 4.0000 mg | Freq: Once | INTRAMUSCULAR | Status: DC | PRN

## 2019-07-04 MED ORDER — STERILE WATER FOR IRRIGATION IR SOLN
Status: DC | PRN
  Administered 2019-07-04: 50 mL

## 2019-07-04 SURGICAL SUPPLY — 6 items
CANISTER SUCT 1200ML W/VALVE (MISCELLANEOUS) ×3 IMPLANT
FORCEPS BIOP RAD 4 LRG CAP 4 (CUTTING FORCEPS) ×1 IMPLANT
GOWN CVR UNV OPN BCK APRN NK (MISCELLANEOUS) ×4 IMPLANT
GOWN ISOL THUMB LOOP REG UNIV (MISCELLANEOUS) ×6
KIT ENDO PROCEDURE OLY (KITS) ×3 IMPLANT
WATER STERILE IRR 250ML POUR (IV SOLUTION) ×3 IMPLANT

## 2019-07-04 NOTE — Op Note (Signed)
[facility] Patient Name: Morgan Tran Procedure Date: 07/04/2019 7:17 AM MRN: [id number] Account #: [id number] Date of Birth: 1971/04/29 Admit Type: Outpatient Age: 49 Room: [facility] OR ROOM 01 Gender: Female Note Status: Finalized Procedure:             Colonoscopy Indications:           Colon cancer screening in patient at increased risk:                         Family history of 1st-degree relative with colon                         polyps, Colon cancer screening in patient at increased                         risk: Family history of colon polyps in multiple                         1st-degree relatives Providers:             Wyline Mood MD, MD Medicines:             Monitored Anesthesia Care Complications:         No immediate complications. Procedure:             Pre-Anesthesia Assessment:                        - Prior to the procedure, a History and Physical was                         performed, and patient medications, allergies and                         sensitivities were reviewed. The patient's tolerance                         of previous anesthesia was reviewed.                        - The risks and benefits of the procedure and the                         sedation options and risks were discussed with the                         patient. All questions were answered and informed                         consent was obtained.                        - After reviewing the risks and benefits, the patient                         was deemed in satisfactory condition to undergo the                         procedure.                        -  ASA Grade Assessment: II - A patient with mild                         systemic disease.                        After obtaining informed consent, the colonoscope was                         passed under direct vision. Throughout the procedure,                         the patient's blood pressure, pulse,  and oxygen                         saturations were monitored continuously. The was                         introduced through the anus and advanced to the the                         cecum, identified by the appendiceal orifice. The                         colonoscopy was performed with ease. The patient                         tolerated the procedure well. The quality of the bowel                         preparation was excellent. Findings:      The perianal and digital rectal examinations were normal.      Multiple small-mouthed diverticula were found in the sigmoid colon.      A 3 mm polyp was found in the sigmoid colon. The polyp was sessile. The       polyp was removed with a cold biopsy forceps. Resection and retrieval       were complete.      The exam was otherwise without abnormality on direct and retroflexion       views. Impression:            - Diverticulosis in the sigmoid colon.                        - One 3 mm polyp in the sigmoid colon, removed with a                         cold biopsy forceps. Resected and retrieved.                        - The examination was otherwise normal on direct and                         retroflexion views. Recommendation:        - Discharge patient to home (with escort).                        - Resume previous diet.                        -  Continue present medications.                        - Await pathology results.                        - Repeat colonoscopy in 5 years for surveillance. Procedure Code(s):     --- Professional ---                        [phone], Colonoscopy, flexible; with biopsy, single or                         multiple Diagnosis Code(s):     --- Professional ---                        K63.5, Polyp of colon                        Z83.71, Family history of colonic polyps                        K57.30, Diverticulosis of large intestine without                         perforation or abscess without bleeding CPT  copyright 2019 American Medical Association. All rights reserved. The codes documented in this report are preliminary and upon coder review may  be revised to meet current compliance requirements. Wyline Mood, MD Wyline Mood MD, MD 07/04/2019 8:05:34 AM This report has been signed electronically. Number of Addenda: 0 Note Initiated On: 07/04/2019 7:17 AM Scope Withdrawal Time: 0 hours 8 minutes 36 seconds  Total Procedure Duration: 0 hours 13 minutes 25 seconds  Estimated Blood Loss:  Estimated blood loss: none.      [facility]

## 2019-07-04 NOTE — H&P (Signed)
Jonathon Bellows, MD [address] [place], [place], [place], [address] [address], [place], [place], [place], [address] Phone: [phone]  Fax: [phone]  Primary Care Physician:  Glean Hess, MD   Pre-Procedure History & Physical: HPI:  Minola Guin is a 49 y.o. female is here for an colonoscopy.   Past Medical History:  Diagnosis Date  . Degenerative disc disease, lumbar   . GERD (gastroesophageal reflux disease)   . Hypertension     Past Surgical History:  Procedure Laterality Date  . ABDOMINAL HYSTERECTOMY     still have both ovaries  . CESAREAN SECTION      Prior to Admission medications   Medication Sig Start Date End Date Taking? Authorizing Provider  calcium-vitamin D (OSCAL WITH D) 500-200 MG-UNIT tablet Take 1 tablet by mouth.   Yes [provider]  losartan-hydrochlorothiazide (HYZAAR) 100-25 MG tablet Take 1 tablet by mouth daily.    Yes [provider]  Multiple Vitamin (MULTIVITAMIN) tablet Take 1 tablet by mouth daily.   Yes [provider]    Allergies as of 06/16/2019 - Review Complete 06/16/2019  Allergen Reaction Noted  . Penicillins Hives 08/14/2012    Family History  Problem Relation Age of Onset  . Colon polyps Mother   . Heart disease Father   . Lung cancer Father   . Breast cancer Neg Hx     Social History   Socioeconomic History  . Marital status: Married    Spouse name: Not on file  . Number of children: 2  . Years of education: Not on file  . Highest education level: Not on file  Occupational History  . Not on file  Tobacco Use  . Smoking status: Current Every Day Smoker    Packs/day: 0.05    Years: 1.00    Pack years: 0.05    Types: Cigarettes  . Smokeless tobacco: Never Used  . Tobacco comment: 1-2 cigs daily  Substance and Sexual Activity  . Alcohol use: No  . Drug use: No  . Sexual activity: Yes  Other Topics Concern  . Not on file  Social History Narrative  . Not  on file   Social Determinants of Health   Financial Resource Strain:   . Difficulty of Paying Living Expenses: Not on file  Food Insecurity:   . Worried About Charity fundraiser in the Last Year: Not on file  . Ran Out of Food in the Last Year: Not on file  Transportation Needs:   . Lack of Transportation (Medical): Not on file  . Lack of Transportation (Non-Medical): Not on file  Physical Activity:   . Days of Exercise per Week: Not on file  . Minutes of Exercise per Session: Not on file  Stress:   . Feeling of Stress : Not on file  Social Connections:   . Frequency of Communication with Friends and Family: Not on file  . Frequency of Social Gatherings with Friends and Family: Not on file  . Attends Religious Services: Not on file  . Active Member of Clubs or Organizations: Not on file  . Attends Archivist Meetings: Not on file  . Marital Status: Not on file  Intimate Partner Violence:   . Fear of Current or Ex-Partner: Not on file  . Emotionally Abused: Not on file  . Physically Abused: Not on file  . Sexually Abused: Not on file    Review of Systems: See HPI, otherwise negative  ROS  Physical Exam: BP (!) 149/87   Pulse 80   Temp (!) 97.3 F (36.3 C) (Temporal)   Ht 5\' 6"  (1.676 m)   Wt 97.5 kg   SpO2 99%   BMI 34.70 kg/m  General:   Alert,  pleasant and cooperative in NAD Head:  Normocephalic and atraumatic. Neck:  Supple; no masses or thyromegaly. Lungs:  Clear throughout to auscultation, normal respiratory effort.    Heart:  +S1, +S2, Regular rate and rhythm, No edema. Abdomen:  Soft, nontender and nondistended. Normal bowel sounds, without guarding, and without rebound.   Neurologic:  Alert and  oriented x4;  grossly normal neurologically.  Impression/Plan: Nikita Surman is here for an colonoscopy to be performed for Screening colonoscopy with family history of colon polyps 1st degree relatives Risks, benefits, limitations, and alternatives  regarding  colonoscopy have been reviewed with the patient.  Questions have been answered.  All parties agreeable.   Garnet Koyanagi, MD  07/04/2019, 7:37 AM

## 2019-07-04 NOTE — Anesthesia Postprocedure Evaluation (Signed)
Anesthesia Post Note  Patient: Morgan Tran  Procedure(s) Performed: COLONOSCOPY WITH PROPOFOL (N/A )     Patient location during evaluation: PACU Anesthesia Type: General Level of consciousness: awake and alert Pain management: pain level controlled Vital Signs Assessment: post-procedure vital signs reviewed and stable Respiratory status: spontaneous breathing, nonlabored ventilation, respiratory function stable and patient connected to nasal cannula oxygen Cardiovascular status: blood pressure returned to baseline and stable Postop Assessment: no apparent nausea or vomiting Anesthetic complications: no    Jadea Shiffer A  Morris Longenecker

## 2019-07-04 NOTE — Transfer of Care (Signed)
Immediate Anesthesia Transfer of Care Note  Patient: [facility]  Procedure(s) Performed: COLONOSCOPY WITH PROPOFOL (N/A )  Patient Location: PACU  Anesthesia Type: General  Level of Consciousness: awake, alert  and patient cooperative  Airway and Oxygen Therapy: Patient Spontanous Breathing and Patient connected to supplemental oxygen  Post-op Assessment: Post-op Vital signs reviewed, Patient's Cardiovascular Status Stable, Respiratory Function Stable, Patent Airway and No signs of Nausea or vomiting  Post-op Vital Signs: Reviewed and stable  Complications: No apparent anesthesia complications

## 2019-07-07 ENCOUNTER — Encounter: Payer: Self-pay | Admitting: *Deleted

## 2019-07-08 ENCOUNTER — Encounter: Payer: Self-pay | Admitting: Internal Medicine

## 2019-07-08 DIAGNOSIS — K635 Polyp of colon: Secondary | ICD-10-CM | POA: Insufficient documentation

## 2019-07-08 LAB — SURGICAL PATHOLOGY

## 2019-07-20 ENCOUNTER — Encounter: Payer: Self-pay | Admitting: Gastroenterology

## 2019-08-16 ENCOUNTER — Ambulatory Visit
Admission: EM | Admit: 2019-08-16 | Discharge: 2019-08-16 | Disposition: A | Discharge status: Home / Self Care | Payer: Commercial Managed Care - PPO | Attending: Emergency Medicine | Admitting: Emergency Medicine

## 2019-08-16 ENCOUNTER — Encounter: Payer: Self-pay | Admitting: Emergency Medicine

## 2019-08-16 ENCOUNTER — Other Ambulatory Visit: Payer: Self-pay

## 2019-08-16 DIAGNOSIS — G43909 Migraine, unspecified, not intractable, without status migrainosus: Secondary | ICD-10-CM | POA: Diagnosis not present

## 2019-08-16 MED ORDER — ONDANSETRON 8 MG PO TBDP: 8.0000 mg | ORAL_TABLET | Freq: Two times a day (BID) | ORAL | 0 refills | Status: DC

## 2019-08-16 MED ORDER — BUTALBITAL-APAP-CAFFEINE 50-325-40 MG PO TABS: 1.0000 | ORAL_TABLET | Freq: Four times a day (QID) | ORAL | 0 refills | Status: AC | PRN

## 2019-08-16 MED ORDER — ONDANSETRON 8 MG PO TBDP
8.0000 mg | ORAL_TABLET | Freq: Once | ORAL | Status: AC
  Administered 2019-08-16: 8 mg via ORAL

## 2019-08-16 MED ORDER — KETOROLAC TROMETHAMINE 60 MG/2ML IM SOLN
60.0000 mg | Freq: Once | INTRAMUSCULAR | Status: AC
  Administered 2019-08-16: 60 mg via INTRAMUSCULAR

## 2019-08-16 NOTE — ED Triage Notes (Signed)
Patient in today c/o migraine since ~4:30am today. Patient has taken Hydrocodone at ~6am without relief. Patient also having emesis.

## 2019-08-16 NOTE — ED Provider Notes (Signed)
[facility]    CSN: [id number] Arrival date & time: 08/16/19  0804      History   Chief Complaint Chief Complaint  Patient presents with  . Migraine    HPI Morgan Tran is a 49 y.o. female.   HPI  48 year old female presents today with a sudden onset of a migraine about 430 this morning.  States that she usually takes NSAIDs but today the ibuprofen did not work and she even tried taking hydrocodone at 6 AM but without relief.  He has had nausea and vomiting.  States the headache is mostly in the temple area causing her to also have photophobia.  She denies any neurological symptoms.  She has had no numbness or tingling has had no difficulties with strength no dysarthria and no facial abnormalities.  Is a history of migraines in the past.  Review of her available medical records reveal history of hypertension and low back pain.  Pain level at the time of examination was a 10.         Past Medical History:  Diagnosis Date  . Degenerative disc disease, lumbar   . GERD (gastroesophageal reflux disease)   . Hypertension     Patient Active Problem List   Diagnosis Date Noted  . Hyperplastic colon polyp 07/08/2019  . Mixed hyperlipidemia 06/16/2019  . Degenerative disc disease, lumbar 12/11/2018  . GERD (gastroesophageal reflux disease) 12/11/2018  . Tobacco use disorder 12/11/2018  . BMI 35.0-35.9,adult 12/11/2018  . HTN (hypertension), benign 08/14/2012  . Surgical menopause 08/14/2012    Past Surgical History:  Procedure Laterality Date  . ABDOMINAL HYSTERECTOMY     still have both ovaries  . CESAREAN SECTION    . COLONOSCOPY WITH PROPOFOL N/A 07/04/2019   Procedure: COLONOSCOPY WITH PROPOFOL;  Surgeon: Jonathon Bellows, MD;  Location: [place];  Service: Endoscopy;  Laterality: N/A;  colon  . POLYPECTOMY  07/04/2019   Procedure: POLYPECTOMY;  Surgeon: Jonathon Bellows, MD;  Location: [place];  Service: Endoscopy;;    OB History   No  obstetric history on file.      Home Medications    Prior to Admission medications   Medication Sig Start Date End Date Taking? Authorizing Provider  calcium-vitamin D (OSCAL WITH D) 500-200 MG-UNIT tablet Take 1 tablet by mouth.   Yes [provider]  losartan-hydrochlorothiazide (HYZAAR) 100-25 MG tablet Take 1 tablet by mouth daily.    Yes [provider]  Multiple Vitamin (MULTIVITAMIN) tablet Take 1 tablet by mouth daily.   Yes [provider]  butalbital-acetaminophen-caffeine (FIORICET) 50-325-40 MG tablet Take 1 tablet by mouth every 6 (six) hours as needed for headache. Do not take more than 6 pills in 24 hours. 08/16/19 08/15/20  Lorin Picket, PA-C  ondansetron (ZOFRAN ODT) 8 MG disintegrating tablet Take 1 tablet (8 mg total) by mouth 2 (two) times daily. 08/16/19   Lorin Picket, PA-C    Family History Family History  Problem Relation Age of Onset  . Colon polyps Mother   . Heart disease Father   . Lung cancer Father   . Breast cancer Neg Hx     Social History Social History   Tobacco Use  . Smoking status: Current Every Day Smoker    Packs/day: 0.05    Years: 1.00    Pack years: 0.05    Types: Cigarettes  . Smokeless tobacco: Never Used  . Tobacco comment: 1-2 cigs daily  Substance Use Topics  .  Alcohol use: No  . Drug use: No     Allergies   Penicillins   Review of Systems Review of Systems  Constitutional: Positive for activity change and appetite change. Negative for chills, fatigue and fever.  Gastrointestinal: Positive for nausea and vomiting.  Neurological: Positive for headaches.  All other systems reviewed and are negative.    Physical Exam Triage Vital Signs ED Triage Vitals  Enc Vitals Group     BP 08/16/19 0819 (!) 156/94     Pulse Rate 08/16/19 0819 76     Resp 08/16/19 0819 18     Temp 08/16/19 0819 98 F (36.7 C)     Temp Source 08/16/19 0819 Oral     SpO2 08/16/19 0819 100 %     Weight  08/16/19 0820 212 lb (96.2 kg)     Height 08/16/19 0820 5\' 6"  (1.676 m)     Head Circumference --      Peak Flow --      Pain Score 08/16/19 0819 10     Pain Loc --      Pain Edu? --      Excl. in GC? --    No data found.  Updated Vital Signs BP (!) 156/94 (BP Location: Left Arm)   Pulse 76   Temp 98 F (36.7 C) (Oral)   Resp 18   Ht 5\' 6"  (1.676 m)   Wt 212 lb (96.2 kg)   SpO2 100%   BMI 34.22 kg/m   Visual Acuity Right Eye Distance:   Left Eye Distance:   Bilateral Distance:    Right Eye Near:   Left Eye Near:    Bilateral Near:     Physical Exam Vitals and nursing note reviewed.  Constitutional:      General: She is not in acute distress.    Appearance: Normal appearance. She is not ill-appearing, toxic-appearing or diaphoretic.  HENT:     Head: Normocephalic and atraumatic.     Nose: Nose normal.     Mouth/Throat:     Mouth: Mucous membranes are moist.  Eyes:     Extraocular Movements: Extraocular movements intact.     Conjunctiva/sclera: Conjunctivae normal.     Pupils: Pupils are equal, round, and reactive to light.  Cardiovascular:     Rate and Rhythm: Normal rate and regular rhythm.     Heart sounds: Normal heart sounds.  Pulmonary:     Breath sounds: Normal breath sounds.  Musculoskeletal:        General: Normal range of motion.     Cervical back: Normal range of motion and neck supple.  Skin:    General: Skin is warm and dry.  Neurological:     General: No focal deficit present.     Mental Status: She is alert and oriented to person, place, and time.     Cranial Nerves: No cranial nerve deficit.     Sensory: No sensory deficit.     Motor: No weakness.     Coordination: Coordination normal.     Gait: Gait normal.  Psychiatric:        Mood and Affect: Mood normal.        Behavior: Behavior normal.        Thought Content: Thought content normal.        Judgment: Judgment normal.      UC Treatments / Results  Labs (all labs ordered are  listed, but only abnormal results are displayed) Labs Reviewed - No  data to display  EKG   Radiology No results found.  Procedures Procedures (including critical care time)  Medications Ordered in UC Medications  ketorolac (TORADOL) injection 60 mg (60 mg Intramuscular Given 08/16/19 0837)  ondansetron (ZOFRAN-ODT) disintegrating tablet 8 mg (8 mg Oral Given 08/16/19 0837)   Patient reported a decrease of her pain from the headache from a 10 to a 0 at the time of discharge. Initial Impression / Assessment and Plan / UC Course  I have reviewed the triage vital signs and the nursing notes.  Pertinent labs & imaging results that were available during my care of the patient were reviewed by me and considered in my medical decision making (see chart for details).   49 year old female presented with the sudden onset of a headache beginning at 430 this morning.  She attempted to use ibuprofen which usually helps but did not provide her with any relief.  She also took hydrocodone she had leftover from a previous visit at 6 AM still with no relief.  She states that it was not the worst headache of her life.  She has had similar headaches in the past but spent a long time.  Neuro exam was nonfocal.  She had accompanying nausea vomiting as well as photophobia.  She was given Zofran 8 mg ODT and 60 mg of Toradol intramuscularly.  After a period of approximately her headache had decreased from a 10 down to a 0.  She did complain of slight fogginess but had no neurological symptoms at the time of her discharge.  She was prescribed Zofran for use at home for nausea but encouraged to drink fluids as much as possible.  She was also given a prescription for Fioricet that she can take at the onset of another headache.  She was given parameters for going to the emergency room if her headache worsened or returned.  She should follow-up with her primary care physician, Dr. Judithann Graves, she continued to  experience the headaches.   Final Clinical Impressions(s) / UC Diagnoses   Final diagnoses:  Migraine without status migrainosus, not intractable, unspecified migraine type     Discharge Instructions     Rest the remainder of the day.  Drink plenty of fluids.  Use Zofran for nausea or vomiting.  Go to the emergency room if your headache is worse or returns.  Follow-up with Dr. Judithann Graves as necessary    ED Prescriptions    Medication Sig Dispense Auth. Provider   ondansetron (ZOFRAN ODT) 8 MG disintegrating tablet Take 1 tablet (8 mg total) by mouth 2 (two) times daily. 6 tablet Lutricia Feil, PA-C   butalbital-acetaminophen-caffeine (FIORICET) 50-325-40 MG tablet Take 1 tablet by mouth every 6 (six) hours as needed for headache. Do not take more than 6 pills in 24 hours. 20 tablet Lutricia Feil, PA-C     PDMP not reviewed this encounter.   Lutricia Feil, PA-C 08/16/19 [phone]

## 2019-08-16 NOTE — Discharge Instructions (Addendum)
Rest the remainder of the day.  Drink plenty of fluids.  Use Zofran for nausea or vomiting.  Go to the emergency room if your headache is worse or returns.  Follow-up with Dr. Judithann Graves as necessary

## 2019-09-06 ENCOUNTER — Ambulatory Visit: Payer: Commercial Managed Care - PPO

## 2019-09-11 ENCOUNTER — Ambulatory Visit: Payer: Commercial Managed Care - PPO

## 2019-09-12 ENCOUNTER — Ambulatory Visit: Payer: Commercial Managed Care - PPO | Attending: Internal Medicine

## 2019-09-12 DIAGNOSIS — Z23 Encounter for immunization: Secondary | ICD-10-CM

## 2019-09-12 NOTE — Progress Notes (Signed)
   Covid-19 Vaccination Clinic  Name:  Morgan Tran    MRN: [id number] DOB: 02/26/1971  09/12/2019  [name] was observed post Covid-19 immunization for 15 minutes without incident. She was provided with Vaccine Information Sheet and instruction to access the V-Safe system.   [name] was instructed to call 911 with any severe reactions post vaccine: Marland Kitchen Difficulty breathing  . Swelling of face and throat  . A fast heartbeat  . A bad rash all over body  . Dizziness and weakness   Immunizations Administered    Name Date Dose VIS Date Route   Pfizer COVID-19 Vaccine 09/12/2019  9:07 AM 0.3 mL 05/09/2019 Intramuscular   Manufacturer: [employer], [employer]   Lot: [id number]   NDC: [address]-0

## 2019-09-16 ENCOUNTER — Ambulatory Visit: Payer: Commercial Managed Care - PPO | Admitting: Internal Medicine

## 2019-10-07 ENCOUNTER — Ambulatory Visit: Payer: Commercial Managed Care - PPO | Attending: Internal Medicine

## 2019-10-07 DIAGNOSIS — Z23 Encounter for immunization: Secondary | ICD-10-CM

## 2019-10-07 NOTE — Progress Notes (Signed)
   Covid-19 Vaccination Clinic  Name:  Morgan Tran    MRN: [id number] DOB: 11-04-1970  10/07/2019  [name] was observed post Covid-19 immunization for 15 minutes without incident. She was provided with Vaccine Information Sheet and instruction to access the V-Safe system.   [name] was instructed to call 911 with any severe reactions post vaccine: Marland Kitchen Difficulty breathing  . Swelling of face and throat  . A fast heartbeat  . A bad rash all over body  . Dizziness and weakness   Immunizations Administered    Name Date Dose VIS Date Route   Pfizer COVID-19 Vaccine 10/07/2019  9:06 AM 0.3 mL 07/23/2018 Intramuscular   Manufacturer: [employer], [employer]   Lot: [id number]   NDC: [address]-9

## 2019-10-15 IMAGING — MG DIGITAL SCREENING BILATERAL MAMMOGRAM WITH TOMO AND CAD
8 series · 8 of 24 positions shown · non-contrast
Comparison: Previous exam(s).

CLINICAL DATA: Screening.

EXAM:
DIGITAL SCREENING BILATERAL MAMMOGRAM WITH TOMO AND CAD

[R CC synth-2D]
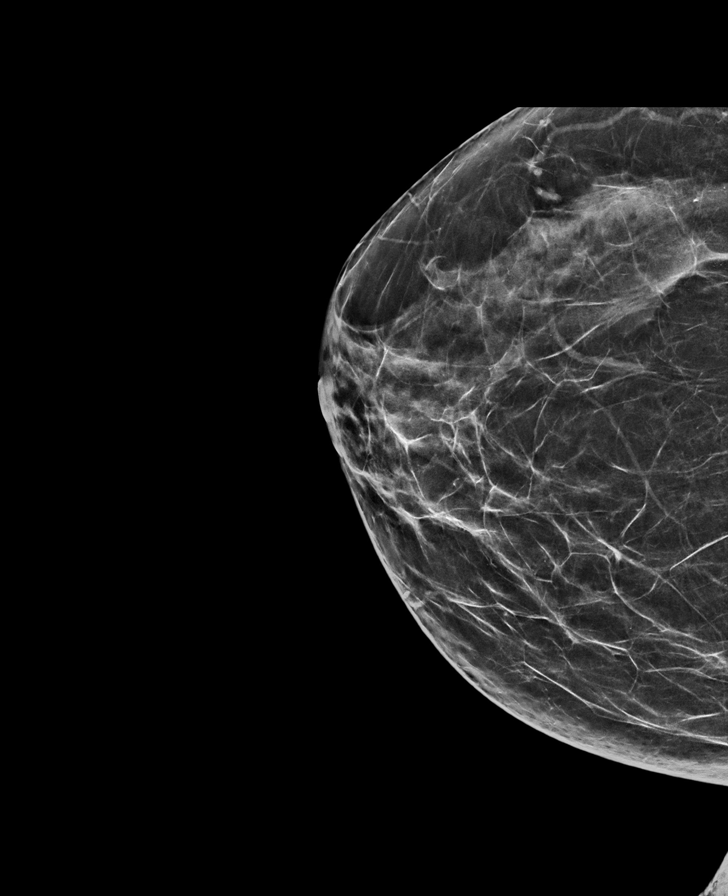

[L MLO synth-2D]
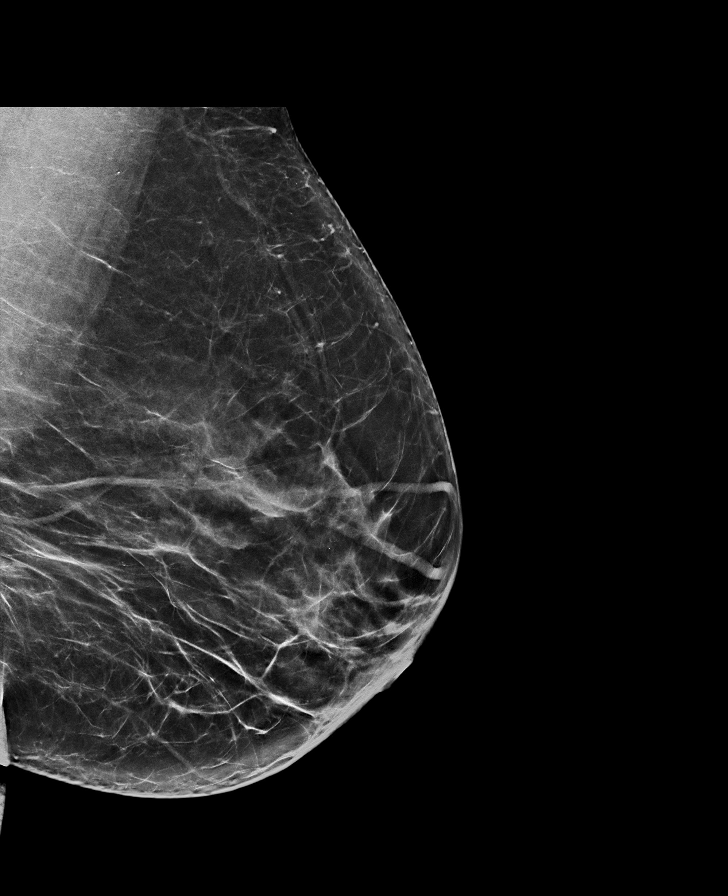

[R MLO synth-2D]
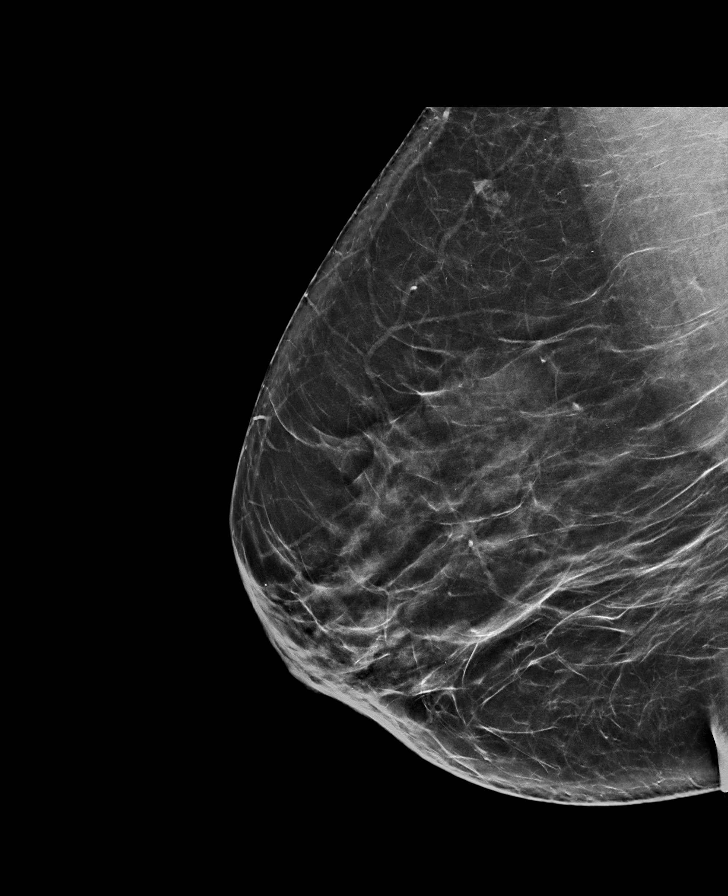

[L CC synth-2D]
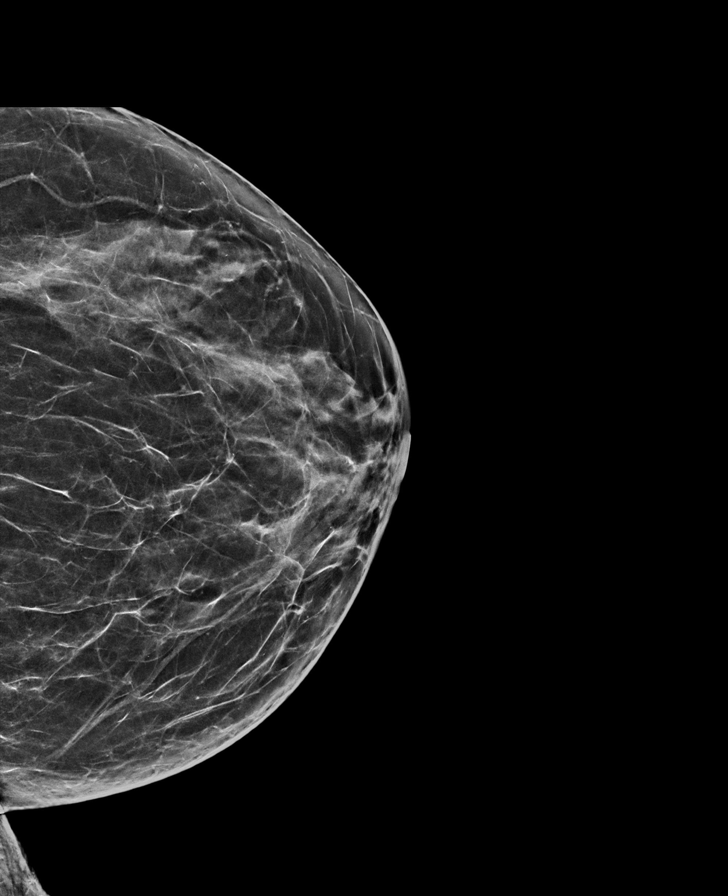

[R MLO tomo · tomo slice 39/78.0]
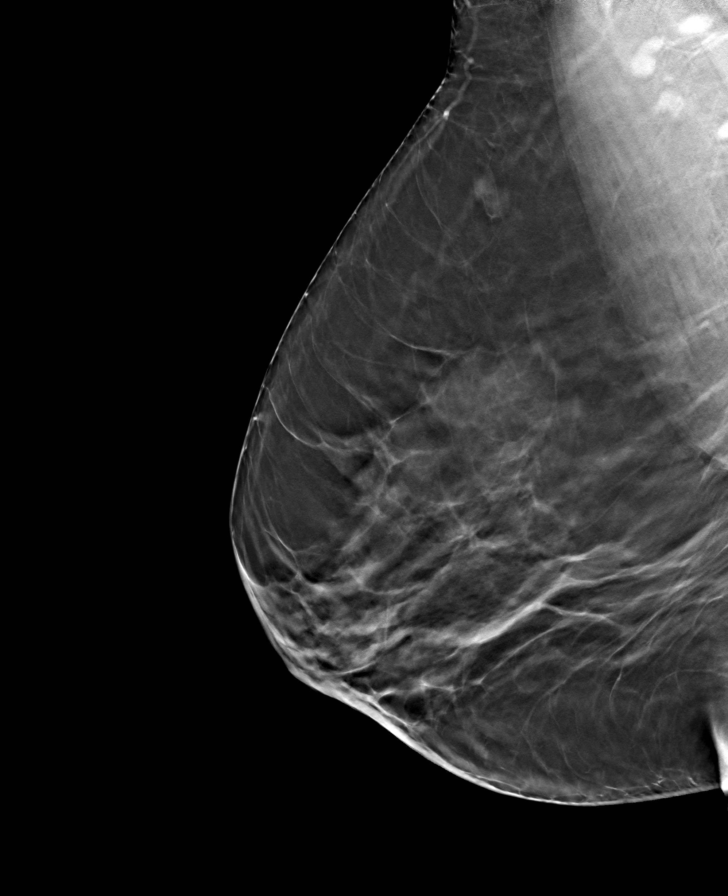

[L CC tomo · tomo slice 34/67.0]
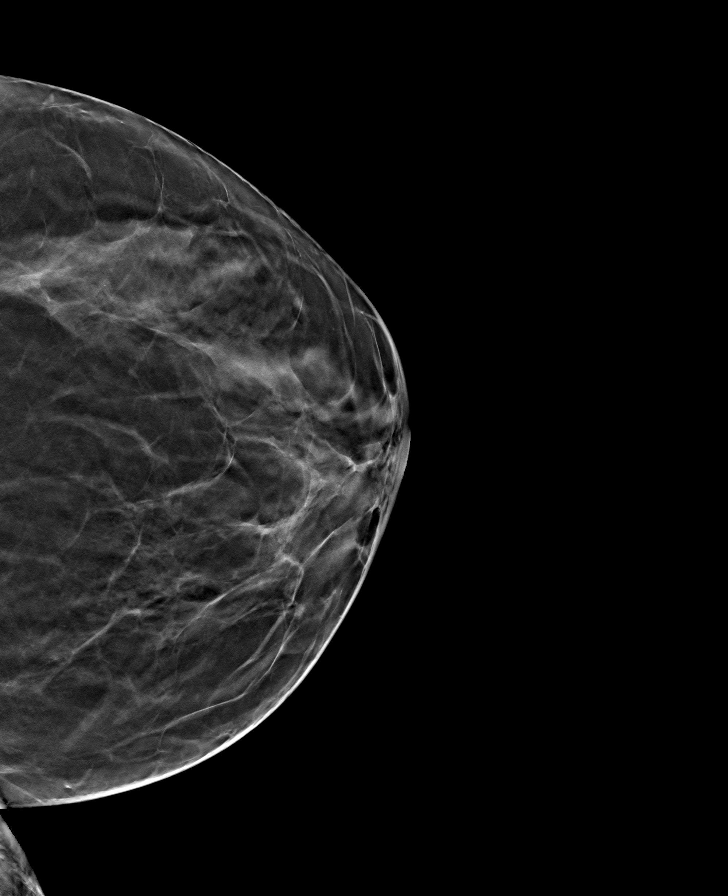

[L MLO tomo · tomo slice 40/79.0]
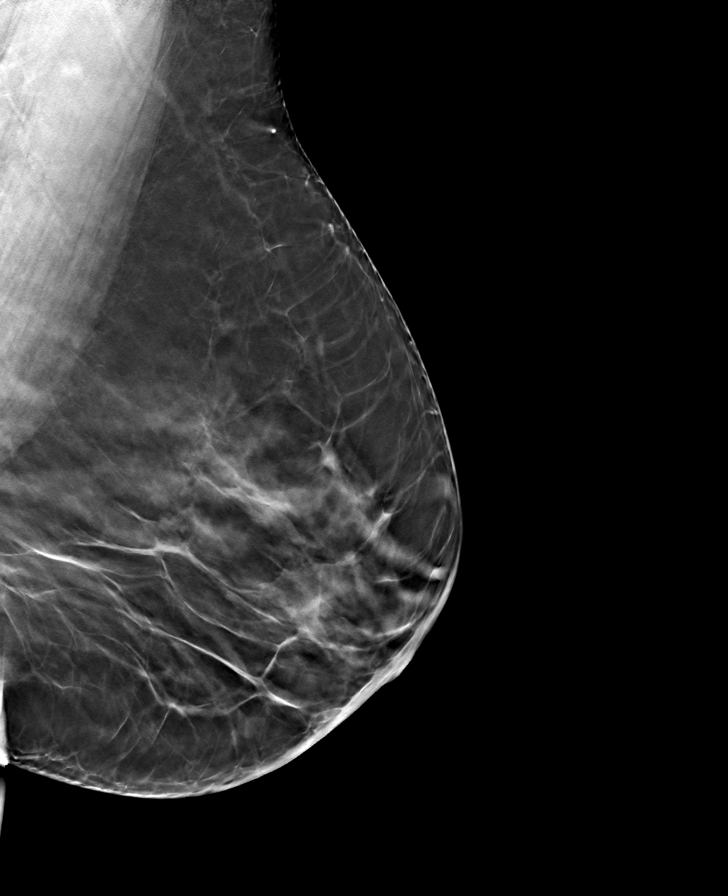

[R CC tomo · tomo slice 35/69.0]
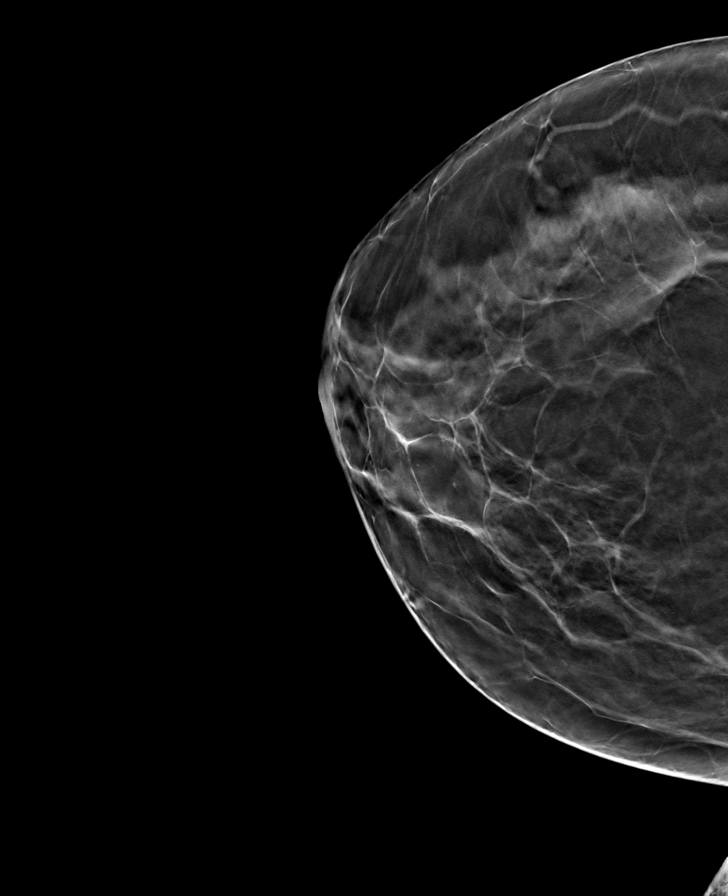

[8 of 24 positions shown; findings below may reference images not displayed]

ACR Breast Density Category b: There are scattered areas of
fibroglandular density.
FINDINGS: There are no findings suspicious for malignancy. Images were
processed with CAD.
IMPRESSION: No mammographic evidence of malignancy. A result letter of this
screening mammogram will be mailed directly to the patient.

RECOMMENDATION:
Screening mammogram in one year. (Code:[id number])

BI-RADS CATEGORY  1: Negative.

## 2019-12-28 ENCOUNTER — Encounter: Payer: Self-pay | Admitting: Internal Medicine

## 2019-12-29 ENCOUNTER — Other Ambulatory Visit: Payer: Self-pay

## 2019-12-30 ENCOUNTER — Other Ambulatory Visit: Payer: Self-pay

## 2019-12-30 MED ORDER — LOSARTAN POTASSIUM-HCTZ 100-25 MG PO TABS: 1.0000 | ORAL_TABLET | Freq: Every day | ORAL | 1 refills | Status: DC

## 2020-06-17 ENCOUNTER — Encounter: Payer: Commercial Managed Care - PPO | Admitting: Internal Medicine

## 2020-10-29 ENCOUNTER — Other Ambulatory Visit: Payer: Self-pay | Admitting: Internal Medicine

## 2020-10-29 NOTE — Telephone Encounter (Signed)
Requested medications are due for refill today yes  Requested medications are on the active medication list yes  Last refill 07/26/20  Last visit 05/2019  Future visit scheduled no, canceled 05/2020  Notes to clinic Failed protocol due to no valid visit within 6  months, no upcoming visit scheduled.

## 2020-11-03 ENCOUNTER — Other Ambulatory Visit: Payer: Self-pay | Admitting: Internal Medicine

## 2020-11-25 ENCOUNTER — Ambulatory Visit: Payer: Commercial Managed Care - PPO | Admitting: Internal Medicine

## 2020-12-09 ENCOUNTER — Other Ambulatory Visit: Payer: Self-pay | Admitting: Internal Medicine

## 2020-12-09 NOTE — Telephone Encounter (Signed)
Pt needs appt. For HTN to get any refills on medication.  KP

## 2020-12-09 NOTE — Telephone Encounter (Signed)
  Notes to clinic:   Patient requests 90 days supply  Requested Prescriptions  Pending Prescriptions Disp Refills   losartan-hydrochlorothiazide (HYZAAR) 100-25 MG tablet [Pharmacy Med Name: LOSARTAN/HCTZ 100/25MG  TABLETS] 90 tablet     Sig: TAKE 1 TABLET BY MOUTH DAILY      Cardiovascular: ARB + Diuretic Combos Failed - 12/09/2020  4:13 AM      Failed - K in normal range and within 180 days    Potassium  Date Value Ref Range Status  06/16/2019 4.6 3.5 - 5.2 mmol/L Final          Failed - Na in normal range and within 180 days    Sodium  Date Value Ref Range Status  06/16/2019 140 134 - 144 mmol/L Final          Failed - Cr in normal range and within 180 days    Creatinine, Ser  Date Value Ref Range Status  06/16/2019 0.88 0.57 - 1.00 mg/dL Final          Failed - Ca in normal range and within 180 days    Calcium  Date Value Ref Range Status  06/16/2019 9.8 8.7 - 10.2 mg/dL Final          Failed - Last BP in normal range    BP Readings from Last 1 Encounters:  08/16/19 (!) 156/94          Failed - Valid encounter within last 6 months    Recent Outpatient Visits           1 year ago Annual physical exam   [facility] Reubin Milan, MD   1 year ago HTN (hypertension), benign   [facility] Reubin Milan, MD                Passed - Patient is not pregnant

## 2020-12-09 NOTE — Telephone Encounter (Signed)
Patient said that she is taking care of her dad at the moment and will call back to make an appointment.

## 2021-03-22 ENCOUNTER — Other Ambulatory Visit: Payer: Self-pay | Admitting: Internal Medicine

## 2021-03-22 NOTE — Telephone Encounter (Signed)
Requested medication (s) are due for refill today- yes  Requested medication (s) are on the active medication list -yes  Future visit scheduled -no  Last refill: 10/29/20 #30  Notes to clinic: Request RF- over due appointment- last RF has notes, fails lab protocol- 06/16/19 last lab on record   Requested Prescriptions  Pending Prescriptions Disp Refills   losartan-hydrochlorothiazide (HYZAAR) 100-25 MG tablet [Pharmacy Med Name: LOSARTAN/HCTZ 100/25MG  TABLETS] 90 tablet     Sig: TAKE 1 TABLET BY MOUTH DAILY     Cardiovascular: ARB + Diuretic Combos Failed - 03/22/2021 10:16 AM      Failed - K in normal range and within 180 days    Potassium  Date Value Ref Range Status  06/16/2019 4.6 3.5 - 5.2 mmol/L Final          Failed - Na in normal range and within 180 days    Sodium  Date Value Ref Range Status  06/16/2019 140 134 - 144 mmol/L Final          Failed - Cr in normal range and within 180 days    Creatinine, Ser  Date Value Ref Range Status  06/16/2019 0.88 0.57 - 1.00 mg/dL Final          Failed - Ca in normal range and within 180 days    Calcium  Date Value Ref Range Status  06/16/2019 9.8 8.7 - 10.2 mg/dL Final          Failed - Last BP in normal range    BP Readings from Last 1 Encounters:  08/16/19 (!) 156/94          Failed - Valid encounter within last 6 months    Recent Outpatient Visits           1 year ago Annual physical exam   [facility] Reubin Milan, MD   2 years ago HTN (hypertension), benign   [facility] Reubin Milan, MD              Passed - Patient is not pregnant         Requested Prescriptions  Pending Prescriptions Disp Refills   losartan-hydrochlorothiazide (HYZAAR) 100-25 MG tablet [Pharmacy Med Name: LOSARTAN/HCTZ 100/25MG  TABLETS] 90 tablet     Sig: TAKE 1 TABLET BY MOUTH DAILY     Cardiovascular: ARB + Diuretic Combos Failed - 03/22/2021 10:16 AM      Failed - K in normal range and  within 180 days    Potassium  Date Value Ref Range Status  06/16/2019 4.6 3.5 - 5.2 mmol/L Final          Failed - Na in normal range and within 180 days    Sodium  Date Value Ref Range Status  06/16/2019 140 134 - 144 mmol/L Final          Failed - Cr in normal range and within 180 days    Creatinine, Ser  Date Value Ref Range Status  06/16/2019 0.88 0.57 - 1.00 mg/dL Final          Failed - Ca in normal range and within 180 days    Calcium  Date Value Ref Range Status  06/16/2019 9.8 8.7 - 10.2 mg/dL Final          Failed - Last BP in normal range    BP Readings from Last 1 Encounters:  08/16/19 (!) 156/94          Failed - Valid  encounter within last 6 months    Recent Outpatient Visits           1 year ago Annual physical exam   [facility] Reubin Milan, MD   2 years ago HTN (hypertension), benign   [facility] Reubin Milan, MD              Passed - Patient is not pregnant        m

## 2021-03-23 NOTE — Telephone Encounter (Signed)
Patient needs an appointment for future refills.  Please schedule. Patient cancelled/no showed last 3 appointment with Dr. Judithann Graves and last seen 05/2019.

## 2021-03-23 NOTE — Telephone Encounter (Signed)
Lvm to have patient set up refill appointment

## 2022-10-31 ENCOUNTER — Emergency Department (HOSPITAL_BASED_OUTPATIENT_CLINIC_OR_DEPARTMENT_OTHER): Payer: BC Managed Care – PPO

## 2022-10-31 ENCOUNTER — Emergency Department (HOSPITAL_BASED_OUTPATIENT_CLINIC_OR_DEPARTMENT_OTHER)
Admission: EM | Admit: 2022-10-31 | Discharge: 2022-10-31 | Disposition: A | Discharge status: Home / Self Care | Payer: BC Managed Care – PPO | Attending: Emergency Medicine | Admitting: Emergency Medicine

## 2022-10-31 ENCOUNTER — Other Ambulatory Visit: Payer: Self-pay

## 2022-10-31 ENCOUNTER — Encounter (HOSPITAL_BASED_OUTPATIENT_CLINIC_OR_DEPARTMENT_OTHER): Payer: Self-pay | Admitting: Emergency Medicine

## 2022-10-31 DIAGNOSIS — M545 Low back pain, unspecified: Secondary | ICD-10-CM | POA: Insufficient documentation

## 2022-10-31 DIAGNOSIS — M25551 Pain in right hip: Secondary | ICD-10-CM | POA: Diagnosis present

## 2022-10-31 MED ORDER — PREDNISONE 10 MG PO TABS: 20.0000 mg | ORAL_TABLET | Freq: Every day | ORAL | 0 refills | Status: DC

## 2022-10-31 MED ORDER — LIDOCAINE 5 % EX PTCH: 1.0000 | MEDICATED_PATCH | CUTANEOUS | 0 refills | Status: DC

## 2022-10-31 MED ORDER — KETOROLAC TROMETHAMINE 30 MG/ML IJ SOLN
30.0000 mg | Freq: Once | INTRAMUSCULAR | Status: AC
  Administered 2022-10-31: 30 mg via INTRAMUSCULAR
  Filled 2022-10-31: qty 1

## 2022-10-31 MED ORDER — HYDROCODONE-ACETAMINOPHEN 5-325 MG PO TABS: 1.0000 | ORAL_TABLET | ORAL | 0 refills | Status: DC | PRN

## 2022-10-31 NOTE — ED Provider Triage Note (Signed)
Emergency Medicine Provider Triage Evaluation Note  [facility] , a 52 y.o. female  was evaluated in triage.  Pt complains of R hip pain and R inguinal numbness. Hip pain x 1.5 weeks now feeling numbness in the left inguinium. No other red flag. No weakness. Ambulatory .  Review of Systems  Positive: Hip pain  Negative: Bowel incontinence  Physical Exam  There were no vitals taken for this visit. Gen:   Awake, no distress   Resp:  Normal effort  MSK:   Moves extremities without difficulty  Other:    Medical Decision Making  Medically screening exam initiated at 5:22 PM.  Appropriate orders placed.  Anaalicia Washington was informed that the remainder of the evaluation will be completed by another provider, this initial triage assessment does not replace that evaluation, and the importance of remaining in the ED until their evaluation is complete.     Arthor Captain, PA-C 10/31/22 1724

## 2022-10-31 NOTE — ED Provider Notes (Signed)
[place] [facility] AT [facility] Provider Note   CSN: [id number] Arrival date & time: 10/31/22  1645    History  Chief Complaint  Patient presents with   Hip Pain    Morgan Tran is a 52 y.o. female for evaluation of right hip pain x 2 weeks.  States that right posterior lateral aspect right hip radiates into her groin.  If she is lays still her pain resolves.  Worse with movement.  No redness, warmth, falls.  Pain does not extend all the way down the leg.  Using Aleve, heat to area.  Described as aching.  Mild sensation to inguinal crease on the right when sitting.  No fever, chest pain, abdominal pain, nausea, vomiting, dysuria, hematuria.  No bowel or bladder incontinence, saddle paresthesia, IVDU, fever, malignancy.  Has had disc issues previously however has been improved for a while     HPI     Home Medications Prior to Admission medications   Medication Sig Start Date End Date Taking? Authorizing Provider  HYDROcodone-acetaminophen (NORCO/VICODIN) 5-325 MG tablet Take 1 tablet by mouth every 4 (four) hours as needed. 10/31/22  Yes Dusan Lipford A, PA-C  lidocaine (LIDODERM) 5 % Place 1 patch onto the skin daily. Remove & Discard patch within 12 hours or as directed by MD 10/31/22  Yes Zyla Dascenzo A, PA-C  predniSONE (DELTASONE) 10 MG tablet Take 2 tablets (20 mg total) by mouth daily. 10/31/22  Yes Jupiter Boys A, PA-C  calcium-vitamin D (OSCAL WITH D) 500-200 MG-UNIT tablet Take 1 tablet by mouth.    [provider]  losartan-hydrochlorothiazide (HYZAAR) 100-25 MG tablet TAKE 1 TABLET BY MOUTH DAILY 10/29/20   Reubin Milan, MD  Multiple Vitamin (MULTIVITAMIN) tablet Take 1 tablet by mouth daily.    [provider]  ondansetron (ZOFRAN ODT) 8 MG disintegrating tablet Take 1 tablet (8 mg total) by mouth 2 (two) times daily. 08/16/19   Lutricia Feil, PA-C      Allergies    Penicillins    Review of Systems    Review of Systems  Constitutional: Negative.   HENT: Negative.    Respiratory: Negative.    Cardiovascular: Negative.   Gastrointestinal: Negative.   Genitourinary: Negative.   Musculoskeletal:        Right lower back/hip pain  Skin: Negative.   Neurological: Negative.   All other systems reviewed and are negative.   Physical Exam Updated Vital Signs BP (!) 148/96   Pulse 76   Temp 98.2 F (36.8 C)   Resp 16   Ht 5\' 6"  (1.676 m)   Wt 99.8 kg   SpO2 96%   BMI 35.51 kg/m  Physical Exam Vitals and nursing note reviewed.  Constitutional:      General: She is not in acute distress.    Appearance: She is well-developed. She is not ill-appearing, toxic-appearing or diaphoretic.  HENT:     Head: Normocephalic and atraumatic.  Eyes:     Pupils: Pupils are equal, round, and reactive to light.  Cardiovascular:     Rate and Rhythm: Normal rate.     Pulses: Normal pulses.     Heart sounds: Normal heart sounds.  Pulmonary:     Effort: Pulmonary effort is normal. No respiratory distress.     Breath sounds: Normal breath sounds.  Abdominal:     General: Bowel sounds are normal. There is no distension.     Palpations: Abdomen is soft.  Musculoskeletal:  Cervical back: Normal range of motion.     Right upper leg: Normal.     Left upper leg: Normal.       Legs:     Comments: Tenderness lateral aspect right hip.  No overlying skin changes.  No midline C/T/L tenderness.  Negative SLR. Full ROM at BLE  Skin:    General: Skin is warm and dry.  Neurological:     General: No focal deficit present.     Mental Status: She is alert.  Psychiatric:        Mood and Affect: Mood normal.     ED Results / Procedures / Treatments   Labs (all labs ordered are listed, but only abnormal results are displayed) Labs Reviewed - No data to display  EKG None  Radiology DG Lumbar Spine Complete  Result Date: 10/31/2022 CLINICAL DATA:  Hip pain radiating towards the groin. EXAM:  LUMBAR SPINE - COMPLETE 4+ VIEW COMPARISON:  None Available. FINDINGS: Five non-rib-bearing lumbar vertebra. Normal alignment. Normal vertebral body heights. No evidence of fracture or compression deformity. There is L5-S1 disc space narrowing. Remaining disc spaces are preserved. Slight L4-L5 facet hypertrophy. No evidence of pars defects or focal bone abnormalities. Two adjacent calcifications to the right of L4 spanning 9 mm may represent proximal ureteral stones. IMPRESSION: 1. Degenerative disc disease at L5-S1. 2. Mild L4-L5 facet hypertrophy. 3. Two adjacent calcifications to the right of L4 may represent proximal ureteral stones. Recommend correlation for symptoms of renal colic. Electronically Signed   By: Narda Rutherford M.D.   On: 10/31/2022 18:02   DG Hip Unilat W or Wo Pelvis 2-3 Views Right  Result Date: 10/31/2022 CLINICAL DATA:  Right hip pain. EXAM: DG HIP (WITH OR WITHOUT PELVIS) 2-3V RIGHT COMPARISON:  None Available. FINDINGS: The hip joint spaces preserved. There is mild lateral acetabular spurring. The femoral head is well seated in the acetabulum. No fracture. No erosion, avascular necrosis or evidence of focal bone lesion. Intact bony pelvis. Pubic symphysis and sacroiliac joints are congruent. Unremarkable soft tissues. IMPRESSION: Mild degenerative lateral acetabular spurring. Electronically Signed   By: Narda Rutherford M.D.   On: 10/31/2022 18:00    Procedures Procedures    Medications Ordered in ED Medications  ketorolac (TORADOL) 30 MG/ML injection 30 mg (30 mg Intramuscular Given 10/31/22 1920)    ED Course/ Medical Decision Making/ A&P    52 year old here for evaluation of right hip and back pain.  Goes into groin.  No urinary symptoms.  No fever, history of IVDU, bowel or bladder incontinence, saddle paresthesia.  Symptoms x 2 weeks.  No recent injury or trauma.  No lower extremity pain or swelling.  Urinary symptoms.  Pain worse with movement, better with rest.  No  overlying skin changes.  Does have some tenderness to her right posterior gluteal region into her right hip.  She has no evidence of cellulitis.  Low suspicion for VTE.  Pulses bilateral low suspicion for ischemia.  Low suspicion for cauda equina, discitis, osteomized, transverse abscess.  Will plan on imaging  X-ray shows indurative changes, possible ureteral stone Hip with acetabular spurring, no effusion.  I discussed results with patient.  She denies any abdominal pain, dysuria, hematuria, pelvic pain.  No history of stones.  I suspect this is an incidental finding.  With regards to her right hip and back pain, nausea paresthetica versus sciatica versus bursitis, low suspicion for septic joint, hemarthrosis, infectious bursitis, compartment syndrome, VTE, ischemia, dissection, AAA, bacterial infectious  process.  Will treat symptomatically.  Will have her return for new or worsening symptoms, follow-up outpatient.  The patient has been appropriately medically screened and/or stabilized in the ED. I have low suspicion for any other emergent medical condition which would require further screening, evaluation or treatment in the ED or require inpatient management.  Patient is hemodynamically stable and in no acute distress.  Patient able to ambulate in department prior to ED.  Evaluation does not show acute pathology that would require ongoing or additional emergent interventions while in the emergency department or further inpatient treatment.  I have discussed the diagnosis with the patient and answered all questions.  Pain is been managed while in the emergency department and patient has no further complaints prior to discharge.  Patient is comfortable with plan discussed in room and is stable for discharge at this time.  I have discussed strict return precautions for returning to the emergency department.  Patient was encouraged to follow-up with PCP/specialist refer to at discharge.                              Medical Decision Making Amount and/or Complexity of Data Reviewed Independent Historian: spouse External Data Reviewed: labs, radiology and notes. Radiology: ordered and independent interpretation performed. Decision-making details documented in ED Course.  Risk OTC drugs. Prescription drug management. Parenteral controlled substances. Decision regarding hospitalization. Diagnosis or treatment significantly limited by social determinants of health.          Final Clinical Impression(s) / ED Diagnoses Final diagnoses:  Acute right-sided low back pain without sciatica    Rx / DC Orders ED Discharge Orders          Ordered    predniSONE (DELTASONE) 10 MG tablet  Daily        10/31/22 1930    HYDROcodone-acetaminophen (NORCO/VICODIN) 5-325 MG tablet  Every 4 hours PRN        10/31/22 1930    lidocaine (LIDODERM) 5 %  Every 24 hours        10/31/22 1930              Aleesia Henney A, PA-C 10/31/22 1951    Vanetta Mulders, MD 10/31/22 2322

## 2022-10-31 NOTE — ED Triage Notes (Signed)
Pt reports right hip pain x 10d, has been taking Aleve QHS, started having numbness today in the hip that radiates toward the groin

## 2022-10-31 NOTE — Discharge Instructions (Signed)
Take the medication as prescribed.  Make sure to follow-up with orthopedics or your primary care provider if symptoms do not improve  Use caution the Norco is an opiate prescription.  It does have the addictive potential.  Do not drive or operate heavy machinery while taking this medication  Return for new or worsening symptoms

## 2022-10-31 NOTE — ED Triage Notes (Signed)
Pt denies any trauma or injury.

## 2023-02-11 ENCOUNTER — Emergency Department (HOSPITAL_COMMUNITY)
Admission: EM | Admit: 2023-02-11 | Discharge: 2023-02-11 | Disposition: A | Discharge status: Home / Self Care | Payer: BC Managed Care – PPO | Attending: Emergency Medicine | Admitting: Emergency Medicine

## 2023-02-11 ENCOUNTER — Encounter (HOSPITAL_COMMUNITY): Payer: Self-pay | Admitting: *Deleted

## 2023-02-11 ENCOUNTER — Other Ambulatory Visit: Payer: Self-pay

## 2023-02-11 DIAGNOSIS — I10 Essential (primary) hypertension: Secondary | ICD-10-CM | POA: Diagnosis not present

## 2023-02-11 DIAGNOSIS — F17210 Nicotine dependence, cigarettes, uncomplicated: Secondary | ICD-10-CM | POA: Insufficient documentation

## 2023-02-11 DIAGNOSIS — R059 Cough, unspecified: Secondary | ICD-10-CM | POA: Diagnosis present

## 2023-02-11 DIAGNOSIS — Z79899 Other long term (current) drug therapy: Secondary | ICD-10-CM | POA: Diagnosis not present

## 2023-02-11 DIAGNOSIS — R519 Headache, unspecified: Secondary | ICD-10-CM

## 2023-02-11 DIAGNOSIS — U071 COVID-19: Secondary | ICD-10-CM | POA: Diagnosis not present

## 2023-02-11 DIAGNOSIS — R030 Elevated blood-pressure reading, without diagnosis of hypertension: Secondary | ICD-10-CM

## 2023-02-11 LAB — CBC WITH DIFFERENTIAL/PLATELET
Abs Immature Granulocytes: 0.36 10*3/uL — ABNORMAL HIGH (ref 0.00–0.07)
Basophils Absolute: 0.1 10*3/uL (ref 0.0–0.1)
Basophils Relative: 0 %
Eosinophils Absolute: 0.2 10*3/uL (ref 0.0–0.5)
Eosinophils Relative: 1 %
HCT: 42.9 % (ref 36.0–46.0)
Hemoglobin: 14 g/dL (ref 12.0–15.0)
Immature Granulocytes: 2 %
Lymphocytes Relative: 33 %
Lymphs Abs: 5.5 10*3/uL — ABNORMAL HIGH (ref 0.7–4.0)
MCH: 29.2 pg (ref 26.0–34.0)
MCHC: 32.6 g/dL (ref 30.0–36.0)
MCV: 89.4 fL (ref 80.0–100.0)
Monocytes Absolute: 1.2 10*3/uL — ABNORMAL HIGH (ref 0.1–1.0)
Monocytes Relative: 7 %
Neutro Abs: 9.5 10*3/uL — ABNORMAL HIGH (ref 1.7–7.7)
Neutrophils Relative %: 57 %
Platelets: 355 10*3/uL (ref 150–400)
RBC: 4.8 MIL/uL (ref 3.87–5.11)
RDW: 13.4 % (ref 11.5–15.5)
WBC: 16.5 10*3/uL — ABNORMAL HIGH (ref 4.0–10.5)
nRBC: 0 % (ref 0.0–0.2)

## 2023-02-11 LAB — COMPREHENSIVE METABOLIC PANEL
ALT: 48 U/L — ABNORMAL HIGH (ref 0–44)
AST: 23 U/L (ref 15–41)
Albumin: 3 g/dL — ABNORMAL LOW (ref 3.5–5.0)
Alkaline Phosphatase: 56 U/L (ref 38–126)
Anion gap: 8 (ref 5–15)
BUN: 13 mg/dL (ref 6–20)
CO2: 32 mmol/L (ref 22–32)
Calcium: 8.6 mg/dL — ABNORMAL LOW (ref 8.9–10.3)
Chloride: 98 mmol/L (ref 98–111)
Creatinine, Ser: 0.89 mg/dL (ref 0.44–1.00)
GFR, Estimated: >60 mL/min (ref >60)
Glucose, Bld: 93 mg/dL (ref 70–99)
Potassium: 3.5 mmol/L (ref 3.5–5.1)
Sodium: 138 mmol/L (ref 135–145)
Total Bilirubin: 0.4 mg/dL (ref 0.3–1.2)
Total Protein: 6.2 g/dL — ABNORMAL LOW (ref 6.5–8.1)

## 2023-02-11 LAB — CBG MONITORING, ED: Glucose-Capillary: 92 mg/dL (ref 70–99)

## 2023-02-11 LAB — SARS CORONAVIRUS 2 BY RT PCR: SARS Coronavirus 2 by RT PCR: POSITIVE — AB

## 2023-02-11 MED ORDER — ACETAMINOPHEN 325 MG PO TABS: 650.0000 mg | ORAL_TABLET | Freq: Four times a day (QID) | ORAL | 0 refills | Status: AC | PRN

## 2023-02-11 MED ORDER — KETOROLAC TROMETHAMINE 15 MG/ML IJ SOLN
15.0000 mg | Freq: Once | INTRAMUSCULAR | Status: AC
  Administered 2023-02-11: 15 mg via INTRAVENOUS
  Filled 2023-02-11: qty 1

## 2023-02-11 MED ORDER — IBUPROFEN 600 MG PO TABS: 600.0000 mg | ORAL_TABLET | Freq: Four times a day (QID) | ORAL | 0 refills | Status: DC | PRN

## 2023-02-11 MED ORDER — PROCHLORPERAZINE MALEATE 5 MG PO TABS: 5.0000 mg | ORAL_TABLET | Freq: Two times a day (BID) | ORAL | 0 refills | Status: DC | PRN

## 2023-02-11 MED ORDER — SODIUM CHLORIDE 0.9 % IV BOLUS
1000.0000 mL | Freq: Once | INTRAVENOUS | Status: AC
  Administered 2023-02-11: 1000 mL via INTRAVENOUS

## 2023-02-11 NOTE — ED Notes (Addendum)
Pt states that after starting the steroid medication she was Rx and the muscle relaxer she was Rx, she has been swollen, and "cannot stop eating". Cbg 92. Pt states after she takes the muscle relaxer, which is twice a day prn, she does not remember things, "like I dont remember doing my daughters hair" and "just real out of it". Also states she tested positive for Covid beginning/mid August

## 2023-02-11 NOTE — ED Triage Notes (Signed)
Pt with HA 3 weeks ago, denies HA at present.  Pt states " I can't find myself" since this morning.  Also c/o swelling all over.  Not able to taste.  Pt was seen at [facility] last week.

## 2023-02-11 NOTE — Discharge Instructions (Addendum)
You were diagnosed with COVID-19.  Recommend you stop taking dexamethasone, tizanidine and Fioricet that are prescribed at previous facility.  I have sent to an alternative medication to take for your headaches as needed.  You may also take over-the-counter acetaminophen or ibuprofen as needed.  Drink plenty fluids over the next few days, get plenty of rest.  Follow-up with your PCP tomorrow.  Recommend you stop smoking.  Please see your PCP tomorrow in regards to your elevated blood pressure readings today  It was a pleasure caring for you today in the emergency department.  Please return to the emergency department for any worsening or worrisome symptoms.

## 2023-02-11 NOTE — ED Notes (Addendum)
Pt ambulated to the BR with a mask on, without assistance. [place] precautions sign placed on pts door

## 2023-02-11 NOTE — ED Provider Notes (Signed)
[place] EMERGENCY DEPARTMENT AT [facility] Provider Note  CSN: [id number] Arrival date & time: 02/11/23 4098  Chief Complaint(s) Altered Mental Status  HPI Analycia Tran is a 52 y.o. female with past medical history as below, significant for DDD, GERD, hypertension, tobacco use, HLD  She was seen at urgent care and then [facility] ER 9/7 with headache, she had CT face/neck which was stable  (?IIH), she was discharged with analgesics advised to follow-up with PCP.  She has yet to follow-up with PCP, return today secondary to loss of taste, feeling unwell, "I can't find myself."  Patient was seen at OSH, started on tizanidine, Fioricet and dexamethasone for her headache.  Patient reports since starting the steroids she has felt very hungry, swollen "all over" and had intermittent bouts of wooziness, feeling "not herself."  She has been coughing some, mild dyspnea.  No fevers or chills, no nausea or vomiting.  She stopped taking the steroids yesterday and the swelling has improved.  Lost her sense of taste and reports food taste terrible now.  She is having some bodyaches, headache has returned.  Denies sick contacts but family members coughing during evaluation   Past Medical History Past Medical History:  Diagnosis Date   Degenerative disc disease, lumbar    GERD (gastroesophageal reflux disease)    Hypertension    Patient Active Problem List   Diagnosis Date Noted   Hyperplastic colon polyp 07/08/2019   Mixed hyperlipidemia 06/16/2019   Degenerative disc disease, lumbar 12/11/2018   GERD (gastroesophageal reflux disease) 12/11/2018   Tobacco use disorder 12/11/2018   BMI 35.0-35.9,adult 12/11/2018   HTN (hypertension), benign 08/14/2012   Surgical menopause 08/14/2012   Home Medication(s) Prior to Admission medications   Medication Sig Start Date End Date Taking? Authorizing Provider  acetaminophen (TYLENOL) 325 MG tablet Take 2 tablets (650 mg total) by mouth every 6  (six) hours as needed. 02/11/23  Yes Tanda Rockers A, DO  ibuprofen (ADVIL) 600 MG tablet Take 1 tablet (600 mg total) by mouth every 6 (six) hours as needed. 02/11/23  Yes Tanda Rockers A, DO  prochlorperazine (COMPAZINE) 5 MG tablet Take 1 tablet (5 mg total) by mouth 2 (two) times daily as needed for up to 10 doses (headache). 02/11/23  Yes Tanda Rockers A, DO  calcium-vitamin D (OSCAL WITH D) 500-200 MG-UNIT tablet Take 1 tablet by mouth.    [provider]  HYDROcodone-acetaminophen (NORCO/VICODIN) 5-325 MG tablet Take 1 tablet by mouth every 4 (four) hours as needed. 10/31/22   Henderly, Britni A, PA-C  lidocaine (LIDODERM) 5 % Place 1 patch onto the skin daily. Remove & Discard patch within 12 hours or as directed by MD 10/31/22   Henderly, Britni A, PA-C  losartan-hydrochlorothiazide (HYZAAR) 100-25 MG tablet TAKE 1 TABLET BY MOUTH DAILY 10/29/20   Reubin Milan, MD  Multiple Vitamin (MULTIVITAMIN) tablet Take 1 tablet by mouth daily.    [provider]  ondansetron (ZOFRAN ODT) 8 MG disintegrating tablet Take 1 tablet (8 mg total) by mouth 2 (two) times daily. 08/16/19   Lutricia Feil, PA-C  Past Surgical History Past Surgical History:  Procedure Laterality Date   ABDOMINAL HYSTERECTOMY     still have both ovaries   CESAREAN SECTION     COLONOSCOPY WITH PROPOFOL N/A 07/04/2019   Procedure: COLONOSCOPY WITH PROPOFOL;  Surgeon: Wyline Mood, MD;  Location: [facility];  Service: Endoscopy;  Laterality: N/A;  colon   POLYPECTOMY  07/04/2019   Procedure: POLYPECTOMY;  Surgeon: Wyline Mood, MD;  Location: [facility];  Service: Endoscopy;;   Family History Family History  Problem Relation Age of Onset   Colon polyps Mother    Heart disease Father    Lung cancer Father    Breast cancer Neg Hx     Social History Social History    Tobacco Use   Smoking status: Some Days    Current packs/day: 0.05    Average packs/day: 0.1 packs/day for 1 year (0.1 ttl pk-yrs)    Types: Cigarettes   Smokeless tobacco: Never   Tobacco comments:    1-2 cigs daily  Vaping Use   Vaping status: Never Used  Substance Use Topics   Alcohol use: No   Drug use: No   Allergies Penicillins  Review of Systems Review of Systems  Constitutional:  Positive for appetite change and fatigue. Negative for fever.  Respiratory:  Positive for shortness of breath.   Cardiovascular:  Positive for leg swelling. Negative for chest pain and palpitations.  Gastrointestinal:  Negative for abdominal pain, diarrhea, nausea and vomiting.  Musculoskeletal:  Positive for arthralgias.  Neurological:  Positive for weakness and headaches. Negative for speech difficulty and light-headedness.  All other systems reviewed and are negative.   Physical Exam Vital Signs  I have reviewed the triage vital signs BP (!) 156/95   Pulse 67   Temp 97.9 F (36.6 C)   Resp 14   Ht 5\' 6"  (1.676 m)   Wt 96.2 kg   SpO2 98%   BMI 34.22 kg/m  Physical Exam Vitals and nursing note reviewed.  Constitutional:      General: She is not in acute distress.    Appearance: Normal appearance.  HENT:     Head: Normocephalic and atraumatic.     Right Ear: External ear normal.     Left Ear: External ear normal.     Nose: Nose normal.     Mouth/Throat:     Mouth: Mucous membranes are dry.  Eyes:     General: No scleral icterus.       Right eye: No discharge.        Left eye: No discharge.     Extraocular Movements: Extraocular movements intact.     Conjunctiva/sclera: Conjunctivae normal.     Pupils: Pupils are equal, round, and reactive to light.  Cardiovascular:     Rate and Rhythm: Normal rate and regular rhythm.     Pulses: Normal pulses.     Heart sounds: Normal heart sounds.  Pulmonary:     Effort: Pulmonary effort is normal. No respiratory distress.      Breath sounds: Normal breath sounds. No stridor.  Abdominal:     General: Abdomen is flat. There is no distension.     Palpations: Abdomen is soft.     Tenderness: There is no abdominal tenderness.  Musculoskeletal:     Cervical back: No rigidity.     Right lower leg: No edema.     Left lower leg: No edema.  Skin:    General: Skin is warm and dry.  Capillary Refill: Capillary refill takes less than 2 seconds.  Neurological:     Mental Status: She is alert and oriented to person, place, and time.     GCS: GCS eye subscore is 4. GCS verbal subscore is 5. GCS motor subscore is 6.     Cranial Nerves: Cranial nerves 2-12 are intact. No dysarthria.     Sensory: Sensation is intact.     Motor: Motor function is intact. No tremor or pronator drift.     Coordination: Coordination is intact. Finger-Nose-Finger Test normal.     Comments: Strength 5/5 bilateral upper and lower extremities No drift, no clonus  Psychiatric:        Mood and Affect: Mood normal.        Behavior: Behavior normal. Behavior is cooperative.     ED Results and Treatments Labs (all labs ordered are listed, but only abnormal results are displayed) Labs Reviewed  SARS CORONAVIRUS 2 BY RT PCR - Abnormal; Notable for the following components:      Result Value   SARS Coronavirus 2 by RT PCR POSITIVE (*)    All other components within normal limits  CBC WITH DIFFERENTIAL/PLATELET - Abnormal; Notable for the following components:   WBC 16.5 (*)    Neutro Abs 9.5 (*)    Lymphs Abs 5.5 (*)    Monocytes Absolute 1.2 (*)    Abs Immature Granulocytes 0.36 (*)    All other components within normal limits  COMPREHENSIVE METABOLIC PANEL - Abnormal; Notable for the following components:   Calcium 8.6 (*)    Total Protein 6.2 (*)    Albumin 3.0 (*)    ALT 48 (*)    All other components within normal limits  CBG MONITORING, ED                                                                                                                           Radiology No results found.  Pertinent labs & imaging results that were available during my care of the patient were reviewed by me and considered in my medical decision making (see MDM for details).  Medications Ordered in ED Medications  sodium chloride 0.9 % bolus 1,000 mL (1,000 mLs Intravenous Bolus 02/11/23 1741)  ketorolac (TORADOL) 15 MG/ML injection 15 mg (15 mg Intravenous Given 02/11/23 1742)  Procedures Procedures  (including critical care time)  Medical Decision Making / ED Course    Medical Decision Making:    Morgan Tran is a 52 y.o. female with past medical history as below, significant for DDD, GERD, hypertension, tobacco use, HLD here with complaint as above. The complaint involves an extensive differential diagnosis and also carries with it a high risk of complications and morbidity.  Serious etiology was considered. Ddx includes but is not limited to: Differential diagnosis includes but is not exclusive to subarachnoid hemorrhage, meningitis, encephalitis, previous head trauma, cavernous venous thrombosis, muscle tension headache, glaucoma, temporal arteritis, migraine or migraine equivalent, etc. In my evaluation of this patient's dyspnea my DDx includes, but is not limited to, pneumonia, pulmonary embolism, pneumothorax, pulmonary edema, metabolic acidosis, asthma, COPD, cardiac cause, anemia, anxiety, etc.    Complete initial physical exam performed, notably the patient  was no acute distress, no hypoxia, blood pressure is elevated.    Reviewed and confirmed nursing documentation for past medical history, family history, social history.  Vital signs reviewed.    Clinical Course as of 02/11/23 1833  Wynelle Link Feb 11, 2023  1735 SARS Coronavirus 2 by RT PCR(!): POSITIVE [SG]  1831 WBC(!): 16.5 Recent use of  steroids, stopped yesterday; not septic.  [SG]    Clinical Course User Index [SG] Sloan Leiter, DO     Patient here with myriad complaints, I have high suspicion for COVID-19.  COVID-19 test was positive, she is feeling better after intervention.  Tolerant p.o. intake with difficulty.  Recommend she stop taking dexamethasone, tizanidine and Fioricet due to adverse reaction.  Start Compazine.  Follow-up PCP tomorrow.  Follow-up tomorrow in regards to elevated blood pressure reading.  She was previously on antihypertensive but appears that she is no longer taking.  Quarantine instructions provided to patient and family.  Rehydration encouraged.  Advised her to see PCP tomorrow in regards to recent imaging at OSH that is concerning for possible IIH  Elevated blood pressure he today, does not demonstrate evidence of endorgan damage.  She has appointment with her PCP tomorrow, recommend further blood pressure medication management at that time.  No need for aggressive intervention during visit this evening  The patient improved significantly and was discharged in stable condition. Detailed discussions were had with the patient regarding current findings, and need for close f/u with PCP or on call doctor. The patient has been instructed to return immediately if the symptoms worsen in any way for re-evaluation. Patient verbalized understanding and is in agreement with current care plan. All questions answered prior to discharge.                  Additional history obtained: -Additional history obtained from family -External records from outside source obtained and reviewed including: Chart review including previous notes, labs, imaging, consultation notes including  Medications, OSH records, prior imaging   Lab Tests: -I ordered, reviewed, and interpreted labs.   The pertinent results include:   Labs Reviewed  SARS CORONAVIRUS 2 BY RT PCR - Abnormal; Notable for the following  components:      Result Value   SARS Coronavirus 2 by RT PCR POSITIVE (*)    All other components within normal limits  CBC WITH DIFFERENTIAL/PLATELET - Abnormal; Notable for the following components:   WBC 16.5 (*)    Neutro Abs 9.5 (*)    Lymphs Abs 5.5 (*)    Monocytes Absolute 1.2 (*)    Abs Immature Granulocytes 0.36 (*)  All other components within normal limits  COMPREHENSIVE METABOLIC PANEL - Abnormal; Notable for the following components:   Calcium 8.6 (*)    Total Protein 6.2 (*)    Albumin 3.0 (*)    ALT 48 (*)    All other components within normal limits  CBG MONITORING, ED    Notable for covid +  EKG   EKG Interpretation Date/Time:    Ventricular Rate:    PR Interval:    QRS Duration:    QT Interval:    QTC Calculation:   R Axis:      Text Interpretation:           Imaging Studies ordered: na   Medicines ordered and prescription drug management: Meds ordered this encounter  Medications   sodium chloride 0.9 % bolus 1,000 mL   ketorolac (TORADOL) 15 MG/ML injection 15 mg   prochlorperazine (COMPAZINE) 5 MG tablet    Sig: Take 1 tablet (5 mg total) by mouth 2 (two) times daily as needed for up to 10 doses (headache).    Dispense:  10 tablet    Refill:  0   ibuprofen (ADVIL) 600 MG tablet    Sig: Take 1 tablet (600 mg total) by mouth every 6 (six) hours as needed.    Dispense:  30 tablet    Refill:  0   acetaminophen (TYLENOL) 325 MG tablet    Sig: Take 2 tablets (650 mg total) by mouth every 6 (six) hours as needed.    Dispense:  36 tablet    Refill:  0    -I have reviewed the patients home medicines and have made adjustments as needed   Consultations Obtained: na   Cardiac Monitoring: Continuous pulse oximetry interpreted by myself, 98% on RA.    Social Determinants of Health:  Diagnosis or treatment significantly limited by social determinants of health: current smoker and obesity Counseled patient for approximately 3 minutes  regarding smoking cessation. Discussed risks of smoking and how they applied and affected their visit here today.  CPT code: 14782: intermediate counseling for smoking cessation     Reevaluation: After the interventions noted above, I reevaluated the patient and found that they have improved  Co morbidities that complicate the patient evaluation  Past Medical History:  Diagnosis Date   Degenerative disc disease, lumbar    GERD (gastroesophageal reflux disease)    Hypertension       Dispostion: Disposition decision including need for hospitalization was considered, and patient discharged from emergency department.    Final Clinical Impression(s) / ED Diagnoses Final diagnoses:  COVID-19  Nonintractable headache, unspecified chronicity pattern, unspecified headache type  Elevated blood pressure reading        Sloan Leiter, DO 02/11/23 1834

## 2023-02-28 ENCOUNTER — Ambulatory Visit (INDEPENDENT_AMBULATORY_CARE_PROVIDER_SITE_OTHER): Payer: BC Managed Care – PPO | Admitting: Diagnostic Neuroimaging

## 2023-02-28 ENCOUNTER — Encounter: Payer: Self-pay | Admitting: Diagnostic Neuroimaging

## 2023-02-28 VITALS — BP 160/102 | HR 74 | Ht 67.0 in | Wt 227.0 lb

## 2023-02-28 DIAGNOSIS — R519 Headache, unspecified: Secondary | ICD-10-CM | POA: Diagnosis not present

## 2023-02-28 MED ORDER — TIZANIDINE HCL 4 MG PO TABS: 4.0000 mg | ORAL_TABLET | Freq: Two times a day (BID) | ORAL | 1 refills | Status: DC | PRN

## 2023-02-28 MED ORDER — TOPIRAMATE 50 MG PO TABS: 50.0000 mg | ORAL_TABLET | Freq: Two times a day (BID) | ORAL | 12 refills | Status: DC

## 2023-02-28 NOTE — Progress Notes (Signed)
[facility]  PATIENT: Morgan Tran DOB: 01/13/1971  REFERRING CLINICIAN: Lianne Moris, PA-C HISTORY FROM: patient  REASON FOR VISIT: new consult   HISTORICAL  CHIEF COMPLAINT:  Chief Complaint  Patient presents with   Headache    Rm 7 with mother Dewayne Hatch Pt is well, reports she had Covid first of August and started having ear/head pain. She went to the hospital and was placed on steroids and shortly after started having sever confusion Currently back to baseline.     HISTORY OF PRESENT ILLNESS:   52 year old female here for evaluation of headaches, brain fog, post COVID symptoms.  Aga second 2024 patient had fever decreased taste, COVID infection.  Symptoms gradually proved over several weeks.  She had return to baseline and then on January 25, 2023 she had onset of right ear pain went to the urgent care and was diagnosed with possible ear infection treated with antibiotics.  She continued to have pain and symptoms including pain in the back of her head on the right side rating up to the top of her head.  [facility]03/2023.  She was noted to have some right neck lymphadenopathy and she had CT of the soft tissue of the neck which was unremarkable.  Partial view of the right transverse sinus appeared somewhat narrow.  Some bone thinning over the right frontal region was favored to reflect arachnoid granulation.  CT of the facial bones was ordered which was unremarkable.  Patient was treated with some medications including steroids.  Over next few days symptoms worsened.  She went to [facility] emergency room on 02/11/2023.  Patient reported feeling swollen all over, dizzy, not feeling herself with brain fog.  COVID test was positive again.  Patient does have a history of migraine headaches from early teenage years including global headaches, nausea, sensitivity to light.  She took some prescription medicines for a little while and then outgrew these  headaches by her 27s.    REVIEW OF SYSTEMS: Full 14 system review of systems performed and negative with exception of: as per HPI.  ALLERGIES: Allergies  Allergen Reactions   Penicillins Hives    HOME MEDICATIONS: Outpatient Medications Prior to Visit  Medication Sig Dispense Refill   acetaminophen (TYLENOL) 325 MG tablet Take 2 tablets (650 mg total) by mouth every 6 (six) hours as needed. 36 tablet 0   atorvastatin (LIPITOR) 20 MG tablet Take 20 mg by mouth daily.     Black Currant Seed Oil 500 MG CAPS Take by mouth daily.     losartan-hydrochlorothiazide (HYZAAR) 100-25 MG tablet TAKE 1 TABLET BY MOUTH DAILY 30 tablet 0   butalbital-acetaminophen-caffeine (FIORICET) 50-325-40 MG tablet Take by mouth.     prochlorperazine (COMPAZINE) 5 MG tablet Take 1 tablet (5 mg total) by mouth 2 (two) times daily as needed for up to 10 doses (headache). 10 tablet 0   tiZANidine (ZANAFLEX) 4 MG tablet Take 4 mg by mouth 3 (three) times daily.     calcium-vitamin D (OSCAL WITH D) 500-200 MG-UNIT tablet Take 1 tablet by mouth. (Patient not taking: Reported on 02/28/2023)     HYDROcodone-acetaminophen (NORCO/VICODIN) 5-325 MG tablet Take 1 tablet by mouth every 4 (four) hours as needed. (Patient not taking: Reported on 02/28/2023) 10 tablet 0   ibuprofen (ADVIL) 600 MG tablet Take 1 tablet (600 mg total) by mouth every 6 (six) hours as needed. (Patient not taking: Reported on 02/28/2023) 30 tablet 0   lidocaine (LIDODERM)  5 % Place 1 patch onto the skin daily. Remove & Discard patch within 12 hours or as directed by MD (Patient not taking: Reported on 02/28/2023) 30 patch 0   Multiple Vitamin (MULTIVITAMIN) tablet Take 1 tablet by mouth daily. (Patient not taking: Reported on 02/28/2023)     ondansetron (ZOFRAN ODT) 8 MG disintegrating tablet Take 1 tablet (8 mg total) by mouth 2 (two) times daily. (Patient not taking: Reported on 02/28/2023) 6 tablet 0   No facility-administered medications prior to visit.     PAST MEDICAL HISTORY: Past Medical History:  Diagnosis Date   Degenerative disc disease, lumbar    GERD (gastroesophageal reflux disease)    Hypertension     PAST SURGICAL HISTORY: Past Surgical History:  Procedure Laterality Date   ABDOMINAL HYSTERECTOMY     still have both ovaries   CESAREAN SECTION     COLONOSCOPY WITH PROPOFOL N/A 07/04/2019   Procedure: COLONOSCOPY WITH PROPOFOL;  Surgeon: Wyline Mood, MD;  Location: [facility];  Service: Endoscopy;  Laterality: N/A;  colon   POLYPECTOMY  07/04/2019   Procedure: POLYPECTOMY;  Surgeon: Wyline Mood, MD;  Location: [facility];  Service: Endoscopy;;    FAMILY HISTORY: Family History  Problem Relation Age of Onset   Colon polyps Mother    Heart disease Father    Lung cancer Father    Breast cancer Neg Hx     SOCIAL HISTORY: Social History   Socioeconomic History   Marital status: Married    Spouse name: Not on file   Number of children: 2   Years of education: Not on file   Highest education level: Not on file  Occupational History   Not on file  Tobacco Use   Smoking status: Some Days    Current packs/day: 0.05    Average packs/day: 0.1 packs/day for 1 year (0.1 ttl pk-yrs)    Types: Cigarettes   Smokeless tobacco: Never   Tobacco comments:    1-2 cigs daily  Vaping Use   Vaping status: Never Used  Substance and Sexual Activity   Alcohol use: No   Drug use: No   Sexual activity: Yes  Other Topics Concern   Not on file  Social History Narrative   Not on file   Social Determinants of Health   Financial Resource Strain: Not on file  Food Insecurity: Not on file  Transportation Needs: Not on file  Physical Activity: Not on file  Stress: Not on file  Social Connections: Unknown (10/10/2021)   Received from [facility]   Social Network    Social Network: Not on file  Intimate Partner Violence: Unknown (09/01/2021)   Received from [facility]   HITS     Physically Hurt: Not on file    Insult or Talk Down To: Not on file    Threaten Physical Harm: Not on file    Scream or Curse: Not on file     PHYSICAL EXAM  GENERAL EXAM/CONSTITUTIONAL: Vitals:  Vitals:   02/28/23 0821 02/28/23 0836  BP: (!) 163/103 (!) 160/102  Pulse: 74 74  Weight: 227 lb (103 kg)   Height: 5\' 7"  (1.702 m)    Body mass index is 35.55 kg/m. Wt Readings from Last 3 Encounters:  02/28/23 227 lb (103 kg)  02/11/23 212 lb (96.2 kg)  10/31/22 220 lb (99.8 kg)   Patient is in no distress; well developed, nourished and groomed; neck is supple  CARDIOVASCULAR: Examination of  carotid arteries is normal; no carotid bruits Regular rate and rhythm, no murmurs Examination of peripheral vascular system by observation and palpation is normal  EYES: Ophthalmoscopic exam of optic discs and posterior segments is normal; no papilledema or hemorrhages No results found.  MUSCULOSKELETAL: Gait, strength, tone, movements noted in Neurologic exam below  NEUROLOGIC: MENTAL STATUS:     02/28/2023    8:27 AM  MMSE - Mini Mental State Exam  Orientation to time 5  Orientation to Place 5  Registration 3  Attention/ Calculation 5  Recall 3  Language- name 2 objects 2  Language- repeat 1  Language- follow 3 step command 3  Language- read & follow direction 1  Write a sentence 1  Copy design 1  Total score 30   awake, alert, oriented to person, place and time recent and remote memory intact normal attention and concentration language fluent, comprehension intact, naming intact fund of knowledge appropriate  CRANIAL NERVE:  2nd - no papilledema on fundoscopic exam 2nd, 3rd, 4th, 6th - pupils equal and reactive to light, visual fields full to confrontation, extraocular muscles intact, no nystagmus 5th - facial sensation symmetric 7th - facial strength symmetric 8th - hearing intact 9th - palate elevates symmetrically, uvula midline 11th - shoulder shrug  symmetric 12th - tongue protrusion midline  MOTOR:  normal bulk and tone, full strength in the BUE, BLE  SENSORY:  normal and symmetric to light touch, pinprick, temperature, vibration  COORDINATION:  finger-nose-finger, fine finger movements normal  REFLEXES:  deep tendon reflexes present and symmetric  GAIT/STATION:  narrow based gait; able to walk on toes, heels and tandem; romberg is negative     DIAGNOSTIC DATA (LABS, IMAGING, TESTING) - I reviewed patient records, labs, notes, testing and imaging myself where available.  Lab Results  Component Value Date   WBC 16.5 (H) 02/11/2023   HGB 14.0 02/11/2023   HCT 42.9 02/11/2023   MCV 89.4 02/11/2023   PLT 355 02/11/2023      Component Value Date/Time   NA 138 02/11/2023 1715   NA 140 06/16/2019 1030   K 3.5 02/11/2023 1715   CL 98 02/11/2023 1715   CO2 32 02/11/2023 1715   GLUCOSE 93 02/11/2023 1715   BUN 13 02/11/2023 1715   BUN 13 06/16/2019 1030   CREATININE 0.89 02/11/2023 1715   CALCIUM 8.6 (L) 02/11/2023 1715   PROT 6.2 (L) 02/11/2023 1715   PROT 7.0 06/16/2019 1030   ALBUMIN 3.0 (L) 02/11/2023 1715   ALBUMIN 4.0 06/16/2019 1030   AST 23 02/11/2023 1715   ALT 48 (H) 02/11/2023 1715   ALKPHOS 56 02/11/2023 1715   BILITOT 0.4 02/11/2023 1715   BILITOT 0.5 06/16/2019 1030   GFRNONAA >60 02/11/2023 1715   GFRAA 89 06/16/2019 1030   Lab Results  Component Value Date   CHOL 258 (H) 06/16/2019   HDL 64 06/16/2019   LDLCALC 183 (H) 06/16/2019   TRIG 68 06/16/2019   CHOLHDL 4.0 06/16/2019   No results found for: "HGBA1C" No results found for: "VITAMINB12" Lab Results  Component Value Date   TSH 0.978 06/16/2019    02/03/2023 CT facial bones 1.  No mass lesion or abnormal enhancement involving the face.   02/03/2023 CT soft tissue neck 1.  No mass or abnormal enhancement in the neck.  2.  Stenotic appearance of the right transverse sinus and CSF isointense osseous thinning of the right frontal bone  which favored to reflect arachnoid granulations over stenoses. No  empty sella. Transverse sinus stenoses can be seen in idiopathic intracranial hypertension in the appropriate clinical setting. If there is clinical concern for IIH, MR could further evaluate.    ASSESSMENT AND PLAN  52 y.o. year old female here with:  Dx:  1. Worsening headaches     PLAN:  RIGHT OCCIPITAL HEADACHE, BRAIN FOG, DIZZINESS (onset of symptoms following COVID infection, with ongoing COVID infection later on.  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  Will do additional testing to rule out other causes. ddx: post COVID syndrome, autoimmune, inflamm, migraine variant) - check MRI brain (rule out autoimmune, inflammatory, demyelinating disease), CT venogram (rule out venous sinus thrombosis) - trial of topiramate 50mg  twice a day; drink plenty of water - ibuprofen, tylenol as needed - tizanidine as needed  Orders Placed This Encounter  Procedures   MR BRAIN W WO CONTRAST   CT VENOGRAM HEAD   Meds ordered this encounter  Medications   topiramate (TOPAMAX) 50 MG tablet    Sig: Take 1 tablet (50 mg total) by mouth 2 (two) times daily.    Dispense:  60 tablet    Refill:  12   tiZANidine (ZANAFLEX) 4 MG tablet    Sig: Take 1 tablet (4 mg total) by mouth 2 (two) times daily as needed for muscle spasms.    Dispense:  60 tablet    Refill:  1   Return in about 3 months (around 05/31/2023) for MyChart visit (15 min).    Suanne Marker, MD 02/28/2023, 9:34 AM Certified in Neurology, Neurophysiology and Neuroimaging  [facility] [address], [address] [place], [place] [address] [phone]

## 2023-02-28 NOTE — Patient Instructions (Signed)
RIGHT OCCIPITAL HEADACHE, BRAIN FOG, DIZZINESS (ddx: post COVID syndrome, autoimmune, inflamm, migraine variant) - check MRI brain, CT venogram - trial of topiramate 50mg  twice a day; drink plenty of water - ibuprofen, tylenol as needed - tizanidine as needed

## 2023-03-01 ENCOUNTER — Encounter: Payer: Self-pay | Admitting: Diagnostic Neuroimaging

## 2023-03-05 ENCOUNTER — Other Ambulatory Visit: Payer: BC Managed Care – PPO

## 2023-03-05 ENCOUNTER — Ambulatory Visit
Admission: RE | Admit: 2023-03-05 | Discharge: 2023-03-05 | Disposition: A | Discharge status: Home / Self Care | Payer: BC Managed Care – PPO | Source: Ambulatory Visit | Attending: Diagnostic Neuroimaging | Admitting: Diagnostic Neuroimaging

## 2023-03-05 DIAGNOSIS — R519 Headache, unspecified: Secondary | ICD-10-CM

## 2023-03-05 MED ORDER — IOPAMIDOL (ISOVUE-370) INJECTION 76%
75.0000 mL | Freq: Once | INTRAVENOUS | Status: AC | PRN
  Administered 2023-03-05: 75 mL via INTRAVENOUS

## 2023-03-06 ENCOUNTER — Encounter: Payer: Self-pay | Admitting: Diagnostic Neuroimaging

## 2023-03-07 ENCOUNTER — Ambulatory Visit (INDEPENDENT_AMBULATORY_CARE_PROVIDER_SITE_OTHER): Payer: BC Managed Care – PPO

## 2023-03-07 DIAGNOSIS — R519 Headache, unspecified: Secondary | ICD-10-CM

## 2023-03-07 MED ORDER — GADOBENATE DIMEGLUMINE 529 MG/ML IV SOLN
20.0000 mL | Freq: Once | INTRAVENOUS | Status: AC | PRN
  Administered 2023-03-07: 20 mL via INTRAVENOUS

## 2023-06-04 ENCOUNTER — Telehealth: Payer: BC Managed Care – PPO | Admitting: Diagnostic Neuroimaging

## 2023-09-12 ENCOUNTER — Other Ambulatory Visit: Payer: Self-pay | Admitting: Diagnostic Neuroimaging

## 2024-05-22 ENCOUNTER — Other Ambulatory Visit: Payer: Self-pay | Admitting: Diagnostic Neuroimaging

## 2024-05-26 NOTE — Telephone Encounter (Signed)
 Last OV 02/28/23 Next OV not scheduled  Last refill 02/28/23 Qty #60/12  Pt has not been seen in > 1 year, needs OV.

## 2024-05-26 NOTE — Addendum Note (Signed)
 Addended by: MARDY LEOTIS RAMAN on: 05/26/2024 01:40 PM   Modules accepted: Orders

## 2024-05-26 NOTE — Telephone Encounter (Signed)
 Pt stated that she is wanting to cx this medication. She stated that she does not know why the pharmacy has requested a refill. She states that she does not need this medication. Please advise.

## 2024-05-26 NOTE — Telephone Encounter (Signed)
 Forwarding to Dr. Margaret as RICK.   Topamax  removed from medication list.
